# Patient Record
Sex: Female | Born: 1940 | Race: Black or African American | Hispanic: No | Smoking: Never smoker
Health system: Southern US, Community
[De-identification: ages and names within clinical notes are randomized; demographics above are authoritative.]

## PROBLEM LIST (undated history)

## (undated) DIAGNOSIS — I10 Essential (primary) hypertension: Secondary | ICD-10-CM

## (undated) DIAGNOSIS — M1990 Unspecified osteoarthritis, unspecified site: Secondary | ICD-10-CM

## (undated) HISTORY — PX: TONSILLECTOMY: SUR1361

## (undated) HISTORY — PX: KNEE SURGERY: SHX244

## (undated) HISTORY — PX: ABDOMINAL HYSTERECTOMY: SHX81

## (undated) HISTORY — PX: APPENDECTOMY: SHX54

---

## 2010-10-27 ENCOUNTER — Encounter: Payer: Self-pay | Admitting: Emergency Medicine

## 2010-10-27 ENCOUNTER — Emergency Department (HOSPITAL_BASED_OUTPATIENT_CLINIC_OR_DEPARTMENT_OTHER)
Admission: EM | Admit: 2010-10-27 | Discharge: 2010-10-27 | Disposition: A | Discharge status: Home / Self Care | Payer: Medicare Other | Attending: Emergency Medicine | Admitting: Emergency Medicine

## 2010-10-27 DIAGNOSIS — B029 Zoster without complications: Secondary | ICD-10-CM | POA: Insufficient documentation

## 2010-10-27 DIAGNOSIS — I10 Essential (primary) hypertension: Secondary | ICD-10-CM | POA: Insufficient documentation

## 2010-10-27 DIAGNOSIS — Z8739 Personal history of other diseases of the musculoskeletal system and connective tissue: Secondary | ICD-10-CM | POA: Insufficient documentation

## 2010-10-27 HISTORY — DX: Unspecified osteoarthritis, unspecified site: M19.90

## 2010-10-27 HISTORY — DX: Essential (primary) hypertension: I10

## 2010-10-27 MED ORDER — VALACYCLOVIR HCL 1 G PO TABS: 1000.0000 mg | ORAL_TABLET | Freq: Three times a day (TID) | ORAL | Status: AC

## 2010-10-27 MED ORDER — HYDROCODONE-ACETAMINOPHEN 5-325 MG PO TABS
1.0000 | ORAL_TABLET | Freq: Once | ORAL | Status: AC
  Administered 2010-10-27: 1 via ORAL
  Filled 2010-10-27: qty 1

## 2010-10-27 MED ORDER — PREDNISONE 50 MG PO TABS: 50.0000 mg | ORAL_TABLET | Freq: Every day | ORAL | Status: AC

## 2010-10-27 MED ORDER — PREDNISONE 20 MG PO TABS
60.0000 mg | ORAL_TABLET | Freq: Once | ORAL | Status: AC
  Administered 2010-10-27: 60 mg via ORAL
  Filled 2010-10-27: qty 3

## 2010-10-27 MED ORDER — HYDROCODONE-ACETAMINOPHEN 5-325 MG PO TABS: 1.0000 | ORAL_TABLET | ORAL | Status: AC | PRN

## 2010-10-27 NOTE — ED Provider Notes (Addendum)
History    Scribed for Mary Booze, MD, the patient was seen in room MH11/MH11 . This chart was scribed by Desma Paganini. This patient's care was started at 6:22 PM .   CSN: [id number] Arrival date & time: 10/27/2010  6:12 PM  Chief Complaint  Patient presents with  . Headache  . Facial Pain   HPI Mary Galloway is a 70 y.o. female who presents to the Emergency Department complaining of left sided facial and ear pain and a left sided HA that has been progressing for 3 days. Pain is described as throbbing and soreness and was rated as a 8/10. Pt states that the top of her head is sensitive to touch. The pt. has not taken any ibuprofen or tylenol for the pain. Pt also has a recent h/o swollen gland behind her left ear, seen by PCP at onset 4 days ago, has been taking Clarithromycin since for a possible "gland infection". Pt is normally in good health. No fever, chills.  PCP Livia Snellen   PAST MEDICAL HISTORY:  Past Medical History  Diagnosis Date  . Hypertension   . Arthritis      PAST SURGICAL HISTORY:  Past Surgical History  Procedure Date  . Abdominal hysterectomy      MEDICATIONS:  Previous Medications   CLARITHROMYCIN (BIAXIN) 500 MG TABLET    Take 500 mg by mouth 2 (two) times daily after a meal.     MELOXICAM (MOBIC) 7.5 MG TABLET    Take 7.5 mg by mouth daily.     QUINAPRIL-HYDROCHLOROTHIAZIDE (ACCURETIC) 20-12.5 MG PER TABLET    Take 1 tablet by mouth daily.       ALLERGIES:  Allergies as of 10/27/2010 - Review Complete 10/27/2010  Allergen Reaction Noted  . Penicillins Rash 10/27/2010     FAMILY HISTORY:   No family history on file.   SOCIAL HISTORY: No smoking No Driking     Review of Systems 10 Systems reviewed and are negative for acute change except as noted in the HPI.  Physical Exam  BP 150/72  Pulse 80  Temp(Src) 97.7 F (36.5 C) (Oral)  Resp 16  SpO2 99%  Physical Exam  Nursing note and vitals reviewed. Constitutional: She is oriented  to person, place, and time. She appears well-developed and well-nourished.  HENT:  Head: Normocephalic and atraumatic.       Mildly tender left post auricular lymph node    Neck: Normal range of motion. Neck supple.  Cardiovascular: Normal rate, normal heart sounds and intact distal pulses.   Pulmonary/Chest: Effort normal and breath sounds normal.  Abdominal: Bowel sounds are normal. She exhibits no distension. There is no tenderness.  Musculoskeletal: Normal range of motion. She exhibits no edema and no tenderness.  Neurological: She is alert and oriented to person, place, and time.  Skin: Skin is warm and dry. No rash noted.  Psychiatric: She has a normal mood and affect.  Careful exam of the scalp showed no rash, but there was tenderness in moving her hair.  ED Course  Procedures  OTHER DATA REVIEWED: Nursing notes and vital signs reviewed.     MDM: Clinically she has early Herpes Zoster. Because her hair would obscure development of a rash, will institute treatment empirically.   IMPRESSION: Herpes zoster    PLAN:  Home  The patient is to return the emergency department if there is any worsening of symptoms. I have reviewed the discharge instructions with the patient.   CONDITION ON DISCHARGE:  Stable    DISCHARGE MEDICATIONS: New Prescriptions   HYDROCODONE-ACETAMINOPHEN (NORCO) 5-325 MG PER TABLET    Take 1 tablet by mouth every 4 (four) hours as needed for pain.   PREDNISONE (DELTASONE) 50 MG TABLET    Take 1 tablet (50 mg total) by mouth daily.   VALACYCLOVIR (VALTREX) 1000 MG TABLET    Take 1 tablet (1,000 mg total) by mouth 3 (three) times daily.    I personally performed the services described in this documentation, which was scribed in my presence. The recorded information has been reviewed and considered.        Mary Booze, MD 10/27/10 1844  Mary Booze, MD 11/21/10 1537

## 2010-10-27 NOTE — ED Notes (Signed)
Pt c/o LT side facial & ear pain, as well as LT side HA x 3 days- is currently taking Clarithromycin for a "gland infection" behind LT ear

## 2011-05-10 ENCOUNTER — Emergency Department (HOSPITAL_BASED_OUTPATIENT_CLINIC_OR_DEPARTMENT_OTHER)
Admission: EM | Admit: 2011-05-10 | Discharge: 2011-05-10 | Disposition: A | Discharge status: Home Health | Payer: Medicare Other | Attending: Emergency Medicine | Admitting: Emergency Medicine

## 2011-05-10 ENCOUNTER — Encounter (HOSPITAL_BASED_OUTPATIENT_CLINIC_OR_DEPARTMENT_OTHER): Payer: Self-pay | Admitting: Emergency Medicine

## 2011-05-10 DIAGNOSIS — M79609 Pain in unspecified limb: Secondary | ICD-10-CM | POA: Insufficient documentation

## 2011-05-10 DIAGNOSIS — R109 Unspecified abdominal pain: Secondary | ICD-10-CM | POA: Insufficient documentation

## 2011-05-10 DIAGNOSIS — M545 Low back pain, unspecified: Secondary | ICD-10-CM | POA: Insufficient documentation

## 2011-05-10 DIAGNOSIS — Z79899 Other long term (current) drug therapy: Secondary | ICD-10-CM | POA: Insufficient documentation

## 2011-05-10 DIAGNOSIS — I10 Essential (primary) hypertension: Secondary | ICD-10-CM | POA: Insufficient documentation

## 2011-05-10 DIAGNOSIS — Z8739 Personal history of other diseases of the musculoskeletal system and connective tissue: Secondary | ICD-10-CM | POA: Insufficient documentation

## 2011-05-10 LAB — URINALYSIS, ROUTINE W REFLEX MICROSCOPIC
Bilirubin Urine: NEGATIVE
Hgb urine dipstick: NEGATIVE
Ketones, ur: NEGATIVE mg/dL
Protein, ur: NEGATIVE mg/dL
Urobilinogen, UA: 1 mg/dL (ref 0.0–1.0)

## 2011-05-10 MED ORDER — DIAZEPAM 5 MG PO TABS: 5.0000 mg | ORAL_TABLET | Freq: Two times a day (BID) | ORAL | Status: AC

## 2011-05-10 MED ORDER — DIAZEPAM 5 MG PO TABS
5.0000 mg | ORAL_TABLET | Freq: Once | ORAL | Status: AC
  Administered 2011-05-10: 5 mg via ORAL
  Filled 2011-05-10: qty 1

## 2011-05-10 MED ORDER — ACETAMINOPHEN 325 MG PO TABS
975.0000 mg | ORAL_TABLET | Freq: Once | ORAL | Status: AC
  Administered 2011-05-10: 975 mg via ORAL
  Filled 2011-05-10: qty 3

## 2011-05-10 NOTE — ED Notes (Signed)
Left flank pain radiating down right leg x 2 weeks.  No numbness or tingling in leg, no dysuria, no problems with BM.  Denies injury.  Lifting worsens pain.  Pain worse last night.

## 2011-05-10 NOTE — ED Provider Notes (Addendum)
History     CSN: [id number]  Arrival date & time 05/10/11  4098   First MD Initiated Contact with Patient 05/10/11 [phone]      Chief Complaint  Patient presents with  . Flank Pain  . Leg Pain    (Consider location/radiation/quality/duration/timing/severity/associated sxs/prior treatment) HPI Comments: Patient complains of right side pain that radiates down to her right hip.  She denies any weakness or numbness in her legs.  She has no bladder or bowel incontinence.  She denies any abdominal pain, nausea, vomiting, changes in bowel habits.  No fevers.  No specific inciting injury.  She does note that this feels similar to when she's had muscle spasms in the past.  She has been trying over-the-counter pain medicine without relief.  She has not seen her primary care physician for this in the last 2 weeks since this started.  The pain has been gradually worsening it is worse last night so she could not sleep which is why she presents this morning.  Patient is a 71 y.o. female presenting with flank pain and leg pain. The history is provided by the patient. No language interpreter was used.  Flank Pain This is a recurrent problem. The current episode started more than 1 week ago. The problem occurs constantly. The problem has been gradually worsening. Pertinent negatives include no chest pain, no abdominal pain, no headaches and no shortness of breath. The symptoms are aggravated by bending (Lifting HER-89-year-old grandson). The symptoms are relieved by nothing. She has tried acetaminophen for the symptoms. The treatment provided mild relief.  Leg Pain     Past Medical History  Diagnosis Date  . Hypertension   . Arthritis     Past Surgical History  Procedure Date  . Abdominal hysterectomy   . Tonsillectomy   . Appendectomy     History reviewed. No pertinent family history.  History  Substance Use Topics  . Smoking status: Never Smoker   . Smokeless tobacco: Not on file  . Alcohol  Use: No    OB History    Grav Para Term Preterm Abortions TAB SAB Ect Mult Living                  Review of Systems  Constitutional: Negative.  Negative for fever and chills.  HENT: Negative.   Eyes: Negative.  Negative for discharge and redness.  Respiratory: Negative.  Negative for cough and shortness of breath.   Cardiovascular: Negative.  Negative for chest pain.  Gastrointestinal: Negative.  Negative for nausea, vomiting, abdominal pain and diarrhea.  Genitourinary: Positive for flank pain. Negative for dysuria and vaginal discharge.  Musculoskeletal: Positive for back pain.  Skin: Negative.  Negative for color change and rash.  Neurological: Negative.  Negative for syncope and headaches.  Hematological: Negative.  Negative for adenopathy.  Psychiatric/Behavioral: Negative.  Negative for confusion.  All other systems reviewed and are negative.    Allergies  Penicillins  Home Medications   Current Outpatient Rx  Name Route Sig Dispense Refill  . CALCIUM CARBONATE 600 MG PO TABS Oral Take 600 mg by mouth 2 (two) times daily.      Marland Kitchen CLARITHROMYCIN 500 MG PO TABS Oral Take 500 mg by mouth 2 (two) times daily after a meal.      . OMEGA-3 FATTY ACIDS 1000 MG PO CAPS Oral Take 1 g by mouth daily.      . IBUPROFEN-DIPHENHYDRAMINE CIT 200-38 MG PO TABS Oral Take 1-2 tablets by mouth at  bedtime as needed. For sleep     . POLYSACCHARIDE IRON COMPLEX 150 MG PO CAPS Oral Take 150 mg by mouth daily.      . MELOXICAM 7.5 MG PO TABS Oral Take 7.5 mg by mouth daily.      Marland Kitchen OMEPRAZOLE MAGNESIUM 20 MG PO TBEC Oral Take 20 mg by mouth daily.      . QUINAPRIL-HYDROCHLOROTHIAZIDE 20-12.5 MG PO TABS Oral Take 1 tablet by mouth daily.        BP 161/79  Pulse 92  Temp(Src) 97.6 F (36.4 C) (Oral)  Resp 22  SpO2 100%  Physical Exam  Nursing note and vitals reviewed. Constitutional: She is oriented to person, place, and time. She appears well-developed and well-nourished.  Non-toxic  appearance. She does not have a sickly appearance.  HENT:  Head: Normocephalic and atraumatic.  Eyes: Conjunctivae, EOM and lids are normal. Pupils are equal, round, and reactive to light. No scleral icterus.  Neck: Trachea normal and normal range of motion. Neck supple.  Cardiovascular: Regular rhythm and normal heart sounds.   Pulmonary/Chest: Effort normal and breath sounds normal.  Abdominal: Soft. Normal appearance. There is no tenderness. There is no rebound, no guarding and no CVA tenderness.  Musculoskeletal: Normal range of motion.       No focal T-spine or L-spine tenderness or step-offs on examination.  Negative straight right leg test bilaterally.  Patient has a palpable DP and PT pulse in that right leg.  She has full range of motion at her right hip and is able to bear weight with ease  Neurological: She is alert and oriented to person, place, and time. She has normal strength.  Skin: Skin is warm, dry and intact. No rash noted.       No rash is noted on her flank  Psychiatric: She has a normal mood and affect. Her behavior is normal. Judgment and thought content normal.    ED Course  Procedures (including critical care time)  Results for orders placed during the hospital encounter of 05/10/11  URINALYSIS, ROUTINE W REFLEX MICROSCOPIC      Component Value Range   Color, Urine YELLOW  YELLOW    APPearance CLEAR  CLEAR    Specific Gravity, Urine 1.012  1.005 - 1.030    pH 7.0  5.0 - 8.0    Glucose, UA NEGATIVE  NEGATIVE (mg/dL)   Hgb urine dipstick NEGATIVE  NEGATIVE    Bilirubin Urine NEGATIVE  NEGATIVE    Ketones, ur NEGATIVE  NEGATIVE (mg/dL)   Protein, ur NEGATIVE  NEGATIVE (mg/dL)   Urobilinogen, UA 1.0  0.0 - 1.0 (mg/dL)   Nitrite NEGATIVE  NEGATIVE    Leukocytes, UA NEGATIVE  NEGATIVE         MDM  Patient appears to have a likely muscle spasm as she has no symptoms that appear to be consistent with sciatica at this time.  She has no neurologic deficits to  indicate spinal cord pathology at this time.  Patient has no abdominal pain or other GI symptoms to suggest that this is an intra-abdominal problem at this point in time.  The pain is not a tearing sensation to suggest aortic dissection.  I will check the patient's urinalysis to rule out urinary tract infection or signs of blood which might indicate kidney stone but at this point Tylenol treatment the patient for muscle spasm and if her urine is negative will have her followup with her primary care physician later this week  for reevaluation.        Nat Christen, MD 05/10/11 [phone]  Patient feels better after the Valium and I will discharge her home.  She's been instructed to followup with her primary care physician if this is not improving and specifically she begins having other new symptoms such as weakness or numbness in her legs.  Nat Christen, MD 05/10/11 1032

## 2011-05-10 NOTE — Discharge Instructions (Signed)
Back Pain, Adult Low back pain is very common. About 1 in 5 people have back pain.The cause of low back pain is rarely dangerous. The pain often gets better over time.About half of people with a sudden onset of back pain feel better in just 2 weeks. About 8 in 10 people feel better by 6 weeks.  CAUSES Some common causes of back pain include:  Strain of the muscles or ligaments supporting the spine.   Wear and tear (degeneration) of the spinal discs.   Arthritis.   Direct injury to the back.  DIAGNOSIS Most of the time, the direct cause of low back pain is not known.However, back pain can be treated effectively even when the exact cause of the pain is unknown.Answering your caregiver's questions about your overall health and symptoms is one of the most accurate ways to make sure the cause of your pain is not dangerous. If your caregiver needs more information, he or she may order lab work or imaging tests (X-rays or MRIs).However, even if imaging tests show changes in your back, this usually does not require surgery. HOME CARE INSTRUCTIONS For many people, back pain returns.Since low back pain is rarely dangerous, it is often a condition that people can learn to manageon their own.   Remain active. It is stressful on the back to sit or stand in one place. Do not sit, drive, or stand in one place for more than 30 minutes at a time. Take short walks on level surfaces as soon as pain allows.Try to increase the length of time you walk each day.   Do not stay in bed.Resting more than 1 or 2 days can delay your recovery.   Do not avoid exercise or work.Your body is made to move.It is not dangerous to be active, even though your back may hurt.Your back will likely heal faster if you return to being active before your pain is gone.   Pay attention to your body when you bend and lift. Many people have less discomfortwhen lifting if they bend their knees, keep the load close to their  bodies,and avoid twisting. Often, the most comfortable positions are those that put less stress on your recovering back.   Find a comfortable position to sleep. Use a firm mattress and lie on your side with your knees slightly bent. If you lie on your back, put a pillow under your knees.   Only take over-the-counter or prescription medicines as directed by your caregiver. Over-the-counter medicines to reduce pain and inflammation are often the most helpful.Your caregiver may prescribe muscle relaxant drugs.These medicines help dull your pain so you can more quickly return to your normal activities and healthy exercise.   Put ice on the injured area.   Put ice in a plastic bag.   Place a towel between your skin and the bag.   Leave the ice on for 15 to 20 minutes, 3 to 4 times a day for the first 2 to 3 days. After that, ice and heat may be alternated to reduce pain and spasms.   Ask your caregiver about trying back exercises and gentle massage. This may be of some benefit.   Avoid feeling anxious or stressed.Stress increases muscle tension and can worsen back pain.It is important to recognize when you are anxious or stressed and learn ways to manage it.Exercise is a great option.  SEEK MEDICAL CARE IF:  You have pain that is not relieved with rest or medicine.   You have   pain that does not improve in 1 week.   You have new symptoms.   You are generally not feeling well.  SEEK IMMEDIATE MEDICAL CARE IF:   You have pain that radiates from your back into your legs.   You develop new bowel or bladder control problems.   You have unusual weakness or numbness in your arms or legs.   You develop nausea or vomiting.   You develop abdominal pain.   You feel faint.  Document Released: 03/03/2005 Document Revised: 11/13/2010 Document Reviewed: 07/22/2010 [employer] Patient Information 2012 [employer]. 

## 2012-08-21 DIAGNOSIS — I10 Essential (primary) hypertension: Secondary | ICD-10-CM | POA: Insufficient documentation

## 2014-04-22 ENCOUNTER — Encounter (HOSPITAL_BASED_OUTPATIENT_CLINIC_OR_DEPARTMENT_OTHER): Payer: Self-pay | Admitting: *Deleted

## 2014-04-22 ENCOUNTER — Emergency Department (HOSPITAL_BASED_OUTPATIENT_CLINIC_OR_DEPARTMENT_OTHER)
Admission: EM | Admit: 2014-04-22 | Discharge: 2014-04-22 | Disposition: A | Discharge status: Home / Self Care | Payer: Medicare HMO | Attending: Emergency Medicine | Admitting: Emergency Medicine

## 2014-04-22 ENCOUNTER — Emergency Department (HOSPITAL_BASED_OUTPATIENT_CLINIC_OR_DEPARTMENT_OTHER): Payer: Medicare HMO

## 2014-04-22 DIAGNOSIS — M1990 Unspecified osteoarthritis, unspecified site: Secondary | ICD-10-CM | POA: Diagnosis not present

## 2014-04-22 DIAGNOSIS — M79605 Pain in left leg: Secondary | ICD-10-CM | POA: Insufficient documentation

## 2014-04-22 DIAGNOSIS — Z79899 Other long term (current) drug therapy: Secondary | ICD-10-CM | POA: Insufficient documentation

## 2014-04-22 DIAGNOSIS — I10 Essential (primary) hypertension: Secondary | ICD-10-CM | POA: Insufficient documentation

## 2014-04-22 MED ORDER — HYDROCODONE-ACETAMINOPHEN 5-325 MG PO TABS
2.0000 | ORAL_TABLET | Freq: Once | ORAL | Status: AC
  Administered 2014-04-22: 2 via ORAL
  Filled 2014-04-22: qty 2

## 2014-04-22 MED ORDER — HYDROCODONE-ACETAMINOPHEN 5-325 MG PO TABS: 1.0000 | ORAL_TABLET | Freq: Four times a day (QID) | ORAL | Status: DC | PRN

## 2014-04-22 NOTE — ED Provider Notes (Signed)
CSN: [id number]     Arrival date & time 04/22/14  1720 History  This chart was scribed for Mary Skeens, MD by [place] Peace, ED Scribe. The patient was seen in MH08/MH08. The patient's care was started at 7:49 PM.    Chief Complaint  Patient presents with  . Leg Pain      Patient is a 74 y.o. female presenting with leg pain. The history is provided by the patient. No language interpreter was used.  Leg Pain Associated symptoms: no fever     HPI Comments: Mary Galloway is a 74 y.o. female who presents to the Emergency Department complaining of radiating left leg pain onset 1 week ago. Pt reports pain extends from foot and up into her hip. Pain is mostly to lateral aspect of her leg. She also complains of chills and weakness in affected leg. Pain exacerbated with ambulation and twisting of her left hip.  She denies any mechanisms of injury or changes in the way she has been walking. No complaints of fever, numbness, or chest pain. History of hypertension and arthritis. Allergies to Penicillin.    Past Medical History  Diagnosis Date  . Hypertension   . Arthritis    Past Surgical History  Procedure Laterality Date  . Abdominal hysterectomy    . Tonsillectomy    . Appendectomy    . Knee surgery     No family history on file. History  Substance Use Topics  . Smoking status: Never Smoker   . Smokeless tobacco: Not on file  . Alcohol Use: No   OB History    No data available     Review of Systems  Constitutional: Positive for chills. Negative for fever.  Cardiovascular: Negative for chest pain.  Musculoskeletal:       Lower left leg pain.   Neurological: Positive for weakness. Negative for numbness.      Allergies  Penicillins  Home Medications   Prior to Admission medications   Medication Sig Start Date End Date Taking? Authorizing Provider  potassium chloride SA (K-DUR,KLOR-CON) 20 MEQ tablet Take 20 mEq by mouth 2 (two) times daily.   Yes Historical Provider,  MD  calcium carbonate (OS-CAL) 600 MG TABS Take 600 mg by mouth 2 (two) times daily.      Historical Provider, MD  clarithromycin (BIAXIN) 500 MG tablet Take 500 mg by mouth 2 (two) times daily after a meal.      Historical Provider, MD  fish oil-omega-3 fatty acids 1000 MG capsule Take 1 g by mouth daily.      Historical Provider, MD  HYDROcodone-acetaminophen (NORCO) 5-325 MG per tablet Take 1 tablet by mouth every 6 (six) hours as needed for severe pain. 04/22/14   Mary Skeens, MD  Ibuprofen-Diphenhydramine Cit 200-38 MG TABS Take 1-2 tablets by mouth at bedtime as needed. For sleep     Historical Provider, MD  iron polysaccharides (NIFEREX) 150 MG capsule Take 150 mg by mouth daily.      Historical Provider, MD  meloxicam (MOBIC) 7.5 MG tablet Take 7.5 mg by mouth daily.      Historical Provider, MD  omeprazole (PRILOSEC OTC) 20 MG tablet Take 20 mg by mouth daily.      Historical Provider, MD  quinapril-hydrochlorothiazide (ACCURETIC) 20-12.5 MG per tablet Take 1 tablet by mouth daily.      Historical Provider, MD   BP 108/69 mmHg  Pulse 89  Temp(Src) 97.7 F (36.5 C) (Oral)  Resp 18  SpO2 100% Physical Exam  Constitutional: She is oriented to person, place, and time. She appears well-developed and well-nourished. No distress.  HENT:  Head: Normocephalic and atraumatic.  Eyes: Conjunctivae and EOM are normal.  Neck: Neck supple. No tracheal deviation present.  Cardiovascular: Normal rate.   Pulses:      Dorsalis pedis pulses are 2+ on the right side, and 2+ on the left side.       Posterior tibial pulses are 2+ on the right side, and 2+ on the left side.  Pulmonary/Chest: Effort normal. No respiratory distress.  Musculoskeletal: Normal range of motion. She exhibits tenderness.  Tenderness to lateral medial lower leg. No warmth, no induration.  No effusion in knee. Good ROM.  Tenderness to lateral thigh.  No tenderness or effusion to ankle.   Neurological: She is alert and  oriented to person, place, and time.  Skin: Skin is warm and dry.  Psychiatric: She has a normal mood and affect. Her behavior is normal.  Nursing note and vitals reviewed.   ED Course  Procedures (including critical care time) Labs Review Labs Reviewed - No data to display  Imaging Review Dg Tibia/fibula Left  04/22/2014   CLINICAL DATA:  One week history of pain involving the lateral aspect of the lower leg. Prior left total knee arthroplasty.  EXAM: LEFT TIBIA AND FIBULA - 2 VIEW  COMPARISON:  None.  FINDINGS: Left total knee arthroplasty with anatomic alignment and no complicating features. No acute fracture involving the tibial or fibula. Well preserved bone mineral density. No intrinsic osseous abnormality. Visualized ankle joint intact.  IMPRESSION: No acute osseous abnormality. 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   Electronically Signed   By: Mary Galloway  Lawrence M.D.   On: 04/22/2014 20:37     EKG Interpretation None      Medications  HYDROcodone-acetaminophen (NORCO/VICODIN) 5-325 MG per tablet 2 tablet (2 tablets Oral Given 04/22/14 1959)    7:54 PM- Treatment plan was discussed with patient who verbalizes understanding and agrees.   MDM   Final diagnoses:  Left leg pain   Patient with arthritis history presents with reproducible pain lateral left leg. No posterior leg or medial leg tenderness, no classic blood clot risk factors. No swelling to the leg. X-ray reviewed no acute fracture. Discussed outpatient follow-up with her orthopedic surgeon and primary doctor.  Results and differential diagnosis were discussed with the patient/parent/guardian. Close follow up outpatient was discussed, comfortable with the plan.   Medications  HYDROcodone-acetaminophen (NORCO/VICODIN) 5-325 MG per tablet 2 tablet (2 tablets Oral Given 04/22/14 1959)    Filed Vitals:   04/22/14 1728  BP: 108/69  Pulse: 89  Temp: 97.7 F (36.5 C)   TempSrc: Oral  Resp: 18  SpO2: 100%    Final diagnoses:  Left leg pain       Mary SkeensJoshua M Letina Luckett, MD 04/22/14 2123

## 2014-04-22 NOTE — ED Notes (Signed)
Patient states that her left leg has been hurting for a few weeks. Denies injury, states that the pain is her entire leg

## 2014-04-22 NOTE — Discharge Instructions (Signed)
If you were given medicines take as directed.  If you are on coumadin or contraceptives realize their levels and effectiveness is altered by many different medicines.  If you have any reaction (rash, tongues swelling, other) to the medicines stop taking and see a physician.  For severe pain take norco or vicodin however realize they have the potential for addiction and it can make you sleepy and has tylenol in it.  No operating machinery while taking.  Please follow up as directed and return to the ER or see a physician for new or worsening symptoms.  Thank you. Filed Vitals:   04/22/14 1728  BP: 108/69  Pulse: 89  Temp: 97.7 F (36.5 C)  TempSrc: Oral  Resp: 18  SpO2: 100%

## 2014-04-28 DIAGNOSIS — M4316 Spondylolisthesis, lumbar region: Secondary | ICD-10-CM | POA: Insufficient documentation

## 2014-04-28 DIAGNOSIS — M5136 Other intervertebral disc degeneration, lumbar region: Secondary | ICD-10-CM | POA: Insufficient documentation

## 2015-11-20 DIAGNOSIS — H40243 Residual stage of angle-closure glaucoma, bilateral: Secondary | ICD-10-CM | POA: Insufficient documentation

## 2016-08-07 DIAGNOSIS — H2510 Age-related nuclear cataract, unspecified eye: Secondary | ICD-10-CM | POA: Insufficient documentation

## 2017-06-30 ENCOUNTER — Emergency Department (HOSPITAL_BASED_OUTPATIENT_CLINIC_OR_DEPARTMENT_OTHER)
Admission: EM | Admit: 2017-06-30 | Discharge: 2017-06-30 | Disposition: A | Discharge status: Home / Self Care | Payer: Medicare HMO | Attending: Emergency Medicine | Admitting: Emergency Medicine

## 2017-06-30 ENCOUNTER — Other Ambulatory Visit: Payer: Self-pay

## 2017-06-30 ENCOUNTER — Emergency Department (HOSPITAL_BASED_OUTPATIENT_CLINIC_OR_DEPARTMENT_OTHER): Payer: Medicare HMO

## 2017-06-30 ENCOUNTER — Encounter (HOSPITAL_BASED_OUTPATIENT_CLINIC_OR_DEPARTMENT_OTHER): Payer: Self-pay | Admitting: *Deleted

## 2017-06-30 DIAGNOSIS — M79604 Pain in right leg: Secondary | ICD-10-CM | POA: Diagnosis present

## 2017-06-30 DIAGNOSIS — I10 Essential (primary) hypertension: Secondary | ICD-10-CM | POA: Diagnosis not present

## 2017-06-30 DIAGNOSIS — Z79899 Other long term (current) drug therapy: Secondary | ICD-10-CM | POA: Diagnosis not present

## 2017-06-30 DIAGNOSIS — M5431 Sciatica, right side: Secondary | ICD-10-CM | POA: Diagnosis not present

## 2017-06-30 MED ORDER — HYDROCODONE-ACETAMINOPHEN 5-325 MG PO TABS
1.0000 | ORAL_TABLET | Freq: Once | ORAL | Status: AC
  Administered 2017-06-30: 1 via ORAL
  Filled 2017-06-30: qty 1

## 2017-06-30 MED ORDER — HYDROCODONE-ACETAMINOPHEN 5-325 MG PO TABS: ORAL_TABLET | ORAL | 0 refills | Status: DC

## 2017-06-30 MED ORDER — PREDNISONE 20 MG PO TABS: ORAL_TABLET | ORAL | 0 refills | Status: DC

## 2017-06-30 NOTE — ED Provider Notes (Signed)
[facility] EMERGENCY DEPARTMENT Provider Note   CSN: [id number] Arrival date & time: 06/30/17  1840     History   Chief Complaint Chief Complaint  Patient presents with  . Leg Pain    HPI Mary Galloway is a 77 y.o. female.  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  Pain is shooting and burning in nature.  She has not had any associated swelling.  It is worse in certain positions and with certain movements.  She has had pain from her back go down into her legs before but cannot remember which leg.  No associated fevers or redness of the leg.  No history of blood clots.  Patient is not on anticoagulation. Patient denies warning symptoms of back pain including: fecal incontinence, urinary retention or overflow incontinence, night sweats, waking from sleep with back pain, unexplained fevers or weight loss, h/o cancer, IVDU, recent trauma.        Past Medical History:  Diagnosis Date  . Arthritis   . Hypertension     There are no active problems to display for this patient.   Past Surgical History:  Procedure Laterality Date  . ABDOMINAL HYSTERECTOMY    . APPENDECTOMY    . KNEE SURGERY    . TONSILLECTOMY       OB History   None      Home Medications    Prior to Admission medications   Medication Sig Start Date End Date Taking? Authorizing Provider  calcium carbonate (OS-CAL) 600 MG TABS Take 600 mg by mouth 2 (two) times daily.      [provider]  clarithromycin (BIAXIN) 500 MG tablet Take 500 mg by mouth 2 (two) times daily after a meal.      [provider]  fish oil-omega-3 fatty acids 1000 MG capsule Take 1 g by mouth daily.      [provider]  HYDROcodone-acetaminophen (NORCO/VICODIN) 5-325 MG tablet Take 0.5-1 tablets every 6 hours as needed for severe pain 06/30/17   Renne Crigler, PA-C  Ibuprofen-Diphenhydramine Cit 200-38 MG TABS Take 1-2 tablets by mouth at bedtime as needed. For sleep     [provider]  iron polysaccharides (NIFEREX) 150 MG capsule Take 150 mg by mouth daily.      [provider]  meloxicam (MOBIC) 7.5 MG tablet Take 7.5 mg by mouth daily.      [provider]  omeprazole (PRILOSEC OTC) 20 MG tablet Take 20 mg by mouth daily.      [provider]  potassium chloride SA (K-DUR,KLOR-CON) 20 MEQ tablet Take 20 mEq by mouth 2 (two) times daily.    [provider]  predniSONE (DELTASONE) 20 MG tablet 3 Tabs PO Days 1-3, then 2 tabs PO Days 4-6, then 1 tab PO Day 7-9, then Half Tab PO Day 10-12 06/30/17   Renne Crigler, PA-C  quinapril-hydrochlorothiazide (ACCURETIC) 20-12.5 MG per tablet Take 1 tablet by mouth daily.      [provider]    Family History History reviewed. No pertinent family history.  Social History Social History   Tobacco Use  . Smoking status: Never Smoker  . Smokeless tobacco: Never Used  Substance Use Topics  . Alcohol use: No  . Drug use: No     Allergies   Penicillins  Review of Systems Review of Systems  Constitutional: Negative for fever and unexpected weight change.  HENT: Negative for rhinorrhea and sore throat.   Eyes: Negative for redness.  Respiratory: Negative for cough.   Cardiovascular: Negative for chest pain and leg swelling.  Gastrointestinal: Negative for abdominal pain, constipation, diarrhea, nausea and vomiting.       Negative for fecal incontinence.   Genitourinary: Negative for dysuria, flank pain, hematuria, pelvic pain, vaginal bleeding and vaginal discharge.       Negative for urinary incontinence or retention.  Musculoskeletal: Positive for myalgias. Negative for back pain.  Skin: Negative for rash.  Neurological: Negative for weakness, numbness and headaches.       Denies saddle paresthesias.     Physical Exam Updated  Vital Signs BP (!) 167/84 (BP Location: Right Arm)   Pulse 94   Temp 98.5 F (36.9 C)   Resp 18   Ht 5\' 1"  (1.549 m)   Wt 96.2 kg (212 lb)   SpO2 98%   BMI 40.06 kg/m   Physical Exam  Constitutional: She appears well-developed and well-nourished.  HENT:  Head: Normocephalic and atraumatic.  Eyes: Conjunctivae are normal.  Neck: Normal range of motion. Neck supple.  Cardiovascular:  Pulses:      Dorsalis pedis pulses are 2+ on the right side, and 2+ on the left side.  Pulmonary/Chest: Effort normal.  Abdominal: Soft. There is no tenderness. There is no CVA tenderness.  Musculoskeletal:       Right hip: Normal.       Left hip: Normal.       Right knee: Normal.       Left knee: Normal.       Cervical back: She exhibits normal range of motion, no tenderness and no bony tenderness.       Thoracic back: She exhibits normal range of motion, no tenderness and no bony tenderness.       Lumbar back: She exhibits normal range of motion, no tenderness and no bony tenderness.  No step-off noted with palpation of spine.   Neurological: She is alert. She has normal strength and normal reflexes. No sensory deficit.  5/5 strength in entire lower extremities bilaterally. No sensation deficit distally.  Patient is ambulatory with a walker without any evident foot drop.  Skin: Skin is warm and dry. No rash noted.  Psychiatric: She has a normal mood and affect.  Nursing note and vitals reviewed.    ED Treatments / Results  Labs (all labs ordered are listed, but only abnormal results are displayed) Labs Reviewed - No data to display  EKG None  Radiology US Venous Img Lower Unilateral Right  Result Date: 06/30/2017 CLINICAL DATA:  Acute onset of right thigh and calf pain. EXAM: RIGHT LOWER EXTREMITY VENOUS DOPPLER ULTRASOUND TECHNIQUE: Gray-scale sonography with graded compression, as well as color Doppler and duplex ultrasound were performed to evaluate the lower extremity deep venous  systems from the level of the common femoral vein and including the common femoral, femoral, profunda femoral, popliteal and calf veins including the posterior tibial, peroneal and gastrocnemius veins when visible. The superficial great saphenous vein was also interrogated. Spectral Doppler was utilized to evaluate flow at rest and with distal augmentation maneuvers in the common femoral, femoral and popliteal veins. COMPARISON:  None. FINDINGS: Contralateral Common Femoral Vein: Respiratory phasicity is normal and symmetric with the symptomatic side. No evidence of thrombus. Normal compressibility. Common Femoral Vein: No evidence of thrombus. Normal compressibility, respiratory  phasicity and response to augmentation. Saphenofemoral Junction: No evidence of thrombus. Normal compressibility and flow on color Doppler imaging. Profunda Femoral Vein: No evidence of thrombus. Normal compressibility and flow on color Doppler imaging. Femoral Vein: No evidence of thrombus. Normal compressibility, respiratory phasicity and response to augmentation. Popliteal Vein: No evidence of thrombus. Normal compressibility, respiratory phasicity and response to augmentation. Calf Veins: No evidence of thrombus. Normal compressibility and flow on color Doppler imaging. Superficial Great Saphenous Vein: No evidence of thrombus. Normal compressibility. Venous Reflux:  None. Other Findings:  None. IMPRESSION: No evidence of deep venous thrombosis. Electronically Signed   By: Roanna RaiderJeffery  Chang M.D.   On: 06/30/2017 22:00    Procedures Procedures (including critical care time)  Medications Ordered in ED Medications  HYDROcodone-acetaminophen (NORCO/VICODIN) 5-325 MG per tablet 1 tablet (1 tablet Oral Given 06/30/17 2059)     Initial Impression / Assessment and Plan / ED Course  I have reviewed the triage vital signs and the nursing notes.  Pertinent labs & imaging results that were available during my care of the patient were  reviewed by me and considered in my medical decision making (see chart for details).     Patient seen and examined.  Pain medication ordered.  Patient with leg pain, new onset.  No injury.  Sounds mostly radicular, however since patient does not have definite history of the same, will rule out blood clot with ultrasound.  2+ pedal pulses.  No signs of infection.  No red flags.  No focal back tenderness on exam.  Vital signs reviewed and are as follows: BP (!) 168/85 (BP Location: Left Arm)   Pulse 96   Temp 98.5 F (36.9 C)   Resp 18   Ht 5\' 1"  (1.549 m)   Wt 96.2 kg (212 lb)   SpO2 95%   BMI 40.06 kg/m   Patient discussed with and seen by Dr. Donnald GarrePfeiffer.  Ultrasound is negative.  11:19 PM patient noted improvement with medication given earlier.  She was updated on results.  We will discharged home with pain medication, continued home anti-inflammatories, and prednisone.  No history of diabetes.  Encouraged PCP follow-up.  Patient was counseled on back pain precautions and told to do activity as tolerated but do not lift, push, or pull heavy objects more than 10 pounds for the next week.  Patient counseled to use ice or heat on back for no longer than 15 minutes every hour.  Encouraged to use walker for stability.  Patient prescribed narcotic pain medicine and counseled on proper use of narcotic pain medications. Counseled not to combine this medication with others containing tylenol.  Use pain medication only under direct supervision at the lowest possible dose needed to control your pain.   Patient urged to follow-up with PCP if pain does not improve with treatment and rest or if pain becomes recurrent. Urged to return with worsening severe pain, loss of bowel or bladder control, trouble walking.   The patient verbalizes understanding and agrees with the plan.    Final Clinical Impressions(s) / ED Diagnoses   Final diagnoses:  Sciatica of right side   Back pain: Patient with  buttock and leg pain consistent with radiculopathy.  US neg for DVT.  No neurological deficits. Patient is ambulatory with a walker. No warning symptoms of back pain including: fecal incontinence, urinary retention or overflow incontinence, night sweats, waking from sleep with back pain, unexplained fevers or weight loss, h/o cancer, IVDU, recent trauma. No concern for  cauda equina, epidural abscess, or other serious cause of back pain. Conservative measures such as rest, ice/heat and pain medicine indicated with PCP follow-up if no improvement with conservative management.    ED Discharge Orders        Ordered    HYDROcodone-acetaminophen (NORCO/VICODIN) 5-325 MG tablet  Status:  Discontinued     06/30/17 2243    predniSONE (DELTASONE) 20 MG tablet  Status:  Discontinued     06/30/17 2243    HYDROcodone-acetaminophen (NORCO/VICODIN) 5-325 MG tablet     06/30/17 2253    predniSONE (DELTASONE) 20 MG tablet     06/30/17 2253       Renne Crigler, PA-C 06/30/17 2321    Arby Barrette, MD 07/01/17 [phone]

## 2017-06-30 NOTE — ED Notes (Signed)
Pt teaching provided on medications that may cause drowsiness. Pt instructed not to drive or operate heavy machinery while taking the prescribed medication. Pt verbalized understanding.   

## 2017-06-30 NOTE — Discharge Instructions (Signed)
Please read and follow all provided instructions.  Your diagnoses today include:  1. Sciatica of right side    Tests performed today include:  Vital signs - see below for your results today  Ultrasound of your leg - no signs of blood clot  Medications prescribed:   Vicodin (hydrocodone/acetaminophen) - narcotic pain medication  DO NOT drive or perform any activities that require you to be awake and alert because this medicine can make you drowsy. BE VERY CAREFUL not to take multiple medicines containing Tylenol (also called acetaminophen). Doing so can lead to an overdose which can damage your liver and cause liver failure and possibly death.  Use pain medication only under direct supervision at the lowest possible dose needed to control your pain.    Prednisone - steroid medicine   It is best to take this medication in the morning to prevent sleeping problems. If you are diabetic, monitor your blood sugar closely and stop taking Prednisone if blood sugar is over 300. Take with food to prevent stomach upset.   Take any prescribed medications only as directed.  Home care instructions:   Follow any educational materials contained in this packet  Please rest, use ice or heat on your back for the next several days  Do not lift, push, pull anything more than 10 pounds for the next week  Follow-up instructions: Please follow-up with your primary care provider in the next 3-5 week for further evaluation of your symptoms.   Return instructions:  SEEK IMMEDIATE MEDICAL ATTENTION IF YOU HAVE:  New numbness, tingling, weakness, or problem with the use of your arms or legs  Severe back pain not relieved with medications  Loss control of your bowels or bladder  Increasing pain in any areas of the body (such as chest or abdominal pain)  Shortness of breath, dizziness, or fainting.   Worsening nausea (feeling sick to your stomach), vomiting, fever, or sweats  Any other emergent  concerns regarding your health   Additional Information:  Your vital signs today were: BP (!) 167/84 (BP Location: Right Arm)    Pulse 94    Temp 98.5 F (36.9 C)    Resp 18    Ht 5\' 1"  (1.549 m)    Wt 96.2 kg (212 lb)    SpO2 98%    BMI 40.06 kg/m  If your blood pressure (BP) was elevated above 135/85 this visit, please have this repeated by your doctor within one month. --------------

## 2017-06-30 NOTE — ED Triage Notes (Signed)
Pt c/o right thigh pain w/o injury x 2 days ago

## 2017-06-30 NOTE — ED Provider Notes (Signed)
Medical screening examination/treatment/procedure(s) were conducted as a shared visit with non-physician practitioner(s) and myself.  I personally evaluated the patient during the encounter.  None Patient reports she has pain that started in her back in her right buttock.  Quality is burning in nature.  She reports it does get worse with certain movements.  She denies she has numbness or weakness of the leg.  She reports in the distant past she did have a bulging disc and think she had some neck similar.  Patient is alert and appropriate.  No acute respiratory distress.  Lungs are clear.  Heart is regular.  Abdomen soft nontender.  She does have pain reproducible to palpation in the right lower back and buttock.  Pain radiates down the leg.  Patient has symmetric 2+ distal pulses.  She is neurologically intact.  Findings are suggestive of sciatica.  Ultrasound negative.  I agree with plan of management.   Arby BarrettePfeiffer, Sydna Brodowski, MD 07/01/17 [phone]

## 2017-07-06 ENCOUNTER — Ambulatory Visit: Payer: Medicare HMO | Admitting: Family Medicine

## 2017-07-06 ENCOUNTER — Encounter: Payer: Self-pay | Admitting: Family Medicine

## 2017-07-06 DIAGNOSIS — M545 Low back pain, unspecified: Secondary | ICD-10-CM | POA: Insufficient documentation

## 2017-07-06 DIAGNOSIS — M79604 Pain in right leg: Secondary | ICD-10-CM | POA: Insufficient documentation

## 2017-07-06 MED ORDER — NAPROXEN 500 MG PO TABS: 500.0000 mg | ORAL_TABLET | Freq: Two times a day (BID) | ORAL | 1 refills | Status: DC | PRN

## 2017-07-06 MED ORDER — HYDROCODONE-ACETAMINOPHEN 7.5-325 MG PO TABS: 1.0000 | ORAL_TABLET | Freq: Four times a day (QID) | ORAL | 0 refills | Status: AC | PRN

## 2017-07-06 MED ORDER — TIZANIDINE HCL 4 MG PO TABS: 4.0000 mg | ORAL_TABLET | Freq: Four times a day (QID) | ORAL | 1 refills | Status: AC | PRN

## 2017-07-06 NOTE — Patient Instructions (Signed)
You have lumbar radiculopathy (a pinched nerve in your low back). Start naproxen 500mg  twice a day with food for pain and inflammation. Norco as needed for severe pain (no driving on this medicine). Tizanidine as needed for muscle spasms (no driving on this medicine if it makes you sleepy). Stay as active as possible. Physical therapy has been shown to be helpful as well. Strengthening of low back muscles, abdominal musculature are key for long term pain relief. We will go ahead with an MRI and I will contact you with the results.

## 2017-07-06 NOTE — Progress Notes (Signed)
PCP: Albertina Senegal, MD  Subjective:   HPI: Patient is a 77 y.o. female here for low back pain.  Patient reports she's had problems with low back in the past, known DDD and had injections. Current pain started about 2 weeks ago in right side of low back into right leg. Pain is sharp, severe at 7/10 level. Associated tingling down into foot and it will feel cold. She took prednisone without much benefit. Not taking any other medication for this. Difficulty getting comfortable. No bowel/bladder dysfunction.  Past Medical History:  Diagnosis Date  . Arthritis   . Hypertension     Current Outpatient Medications on File Prior to Visit  Medication Sig Dispense Refill  . hydrochlorothiazide (HYDRODIURIL) 25 MG tablet TAKE 1 TABLET EVERY DAY    . amLODipine (NORVASC) 2.5 MG tablet Take 2.5 mg by mouth daily.  2  . calcium carbonate (OS-CAL) 600 MG TABS Take 600 mg by mouth 2 (two) times daily.      . celecoxib (CELEBREX) 200 MG capsule TAKE 1 CAPSULE (200 MG TOTAL) BY MOUTH DAILY. FOLLOW UP APPT REQUIRED FOR ADDITIONAL REFILLS!  0  . Cholecalciferol (VITAMIN D3) 2000 units capsule Take by mouth.    . dorzolamide-timolol (COSOPT) 22.3-6.8 MG/ML ophthalmic solution INSTILL 1 DROP INTO BOTH EYES TWICE A DAY  11  . potassium chloride SA (K-DUR,KLOR-CON) 20 MEQ tablet Take 20 mEq by mouth 2 (two) times daily.    . quinapril (ACCUPRIL) 40 MG tablet TAKE 1 TABLET (40 MG TOTAL) BY MOUTH NIGHTLY.  3   No current facility-administered medications on file prior to visit.     Past Surgical History:  Procedure Laterality Date  . ABDOMINAL HYSTERECTOMY    . APPENDECTOMY    . KNEE SURGERY    . TONSILLECTOMY      Allergies  Allergen Reactions  . Brimonidine Other (See Comments)    Toxic conjunctivitis Toxic conjunctivitis   . Cephalexin Rash  . Penicillins Rash    Social History   Socioeconomic History  . Marital status: Widowed    Spouse name: Not on file  . Number of children:  Not on file  . Years of education: Not on file  . Highest education level: Not on file  Occupational History  . Not on file  Social Needs  . Financial resource strain: Not on file  . Food insecurity:    Worry: Not on file    Inability: Not on file  . Transportation needs:    Medical: Not on file    Non-medical: Not on file  Tobacco Use  . Smoking status: Never Smoker  . Smokeless tobacco: Never Used  Substance and Sexual Activity  . Alcohol use: No  . Drug use: No  . Sexual activity: Not on file  Lifestyle  . Physical activity:    Days per week: Not on file    Minutes per session: Not on file  . Stress: Not on file  Relationships  . Social connections:    Talks on phone: Not on file    Gets together: Not on file    Attends religious service: Not on file    Active member of club or organization: Not on file    Attends meetings of clubs or organizations: Not on file    Relationship status: Not on file  . Intimate partner violence:    Fear of current or ex partner: Not on file    Emotionally abused: Not on file    Physically  abused: Not on file    Forced sexual activity: Not on file  Other Topics Concern  . Not on file  Social History Narrative  . Not on file    History reviewed. No pertinent family history.  BP 137/76   Pulse 76   Ht 5\' 2"  (1.575 m)   Wt 212 lb (96.2 kg)   BMI 38.78 kg/m   Review of Systems: See HPI above.     Objective:  Physical Exam:  Gen: NAD, sitting in wheelchair.  Back: No gross deformity, scoliosis. TTP right gluteal region and laterally including trochanter.  No midline or bony TTP. ROM limited due to pain. Strength LEs 5/5 all muscle groups.   Trace MSRs in patellar tendons.  1+ left achilles, trace right achilles MSRs. Negative SLRs. Sensation diminished lateral right lower leg.  No other abnormalities - otherwise NVI distally.  Right leg: No deformity. FROM with 5/5 strength. Tenderness as noted above.  No other leg  tenderness. Sensation diminished right lower leg laterally - otherwise NVI distally. Negative logroll   Assessment & Plan:  1. Low back pain with radiation into right leg - consistent with lumbar radiculopathy.  Unfortunately not improved with prednisone dose pack.  Will go ahead with MRI to further assess.  Naproxen with norco and tizanidine as needed in meantime.

## 2017-07-06 NOTE — Assessment & Plan Note (Signed)
consistent with lumbar radiculopathy.  Unfortunately not improved with prednisone dose pack.  Will go ahead with MRI to further assess.  Naproxen with norco and tizanidine as needed in meantime.

## 2019-03-29 ENCOUNTER — Encounter (HOSPITAL_BASED_OUTPATIENT_CLINIC_OR_DEPARTMENT_OTHER): Payer: Self-pay | Admitting: Emergency Medicine

## 2019-03-29 ENCOUNTER — Emergency Department (HOSPITAL_BASED_OUTPATIENT_CLINIC_OR_DEPARTMENT_OTHER)
Admission: EM | Admit: 2019-03-29 | Discharge: 2019-03-29 | Disposition: A | Discharge status: Home / Self Care | Payer: Medicare HMO | Attending: Emergency Medicine | Admitting: Emergency Medicine

## 2019-03-29 ENCOUNTER — Other Ambulatory Visit: Payer: Self-pay

## 2019-03-29 ENCOUNTER — Emergency Department (HOSPITAL_BASED_OUTPATIENT_CLINIC_OR_DEPARTMENT_OTHER): Payer: Medicare HMO

## 2019-03-29 DIAGNOSIS — Z79899 Other long term (current) drug therapy: Secondary | ICD-10-CM | POA: Diagnosis not present

## 2019-03-29 DIAGNOSIS — Y939 Activity, unspecified: Secondary | ICD-10-CM | POA: Diagnosis not present

## 2019-03-29 DIAGNOSIS — X58XXXA Exposure to other specified factors, initial encounter: Secondary | ICD-10-CM | POA: Diagnosis not present

## 2019-03-29 DIAGNOSIS — Y999 Unspecified external cause status: Secondary | ICD-10-CM | POA: Insufficient documentation

## 2019-03-29 DIAGNOSIS — I10 Essential (primary) hypertension: Secondary | ICD-10-CM | POA: Insufficient documentation

## 2019-03-29 DIAGNOSIS — S199XXA Unspecified injury of neck, initial encounter: Secondary | ICD-10-CM | POA: Diagnosis present

## 2019-03-29 DIAGNOSIS — S161XXA Strain of muscle, fascia and tendon at neck level, initial encounter: Secondary | ICD-10-CM | POA: Diagnosis not present

## 2019-03-29 DIAGNOSIS — Y929 Unspecified place or not applicable: Secondary | ICD-10-CM | POA: Insufficient documentation

## 2019-03-29 MED ORDER — LIDOCAINE 5 % EX PTCH: 1.0000 | MEDICATED_PATCH | CUTANEOUS | 0 refills | Status: AC

## 2019-03-29 NOTE — ED Triage Notes (Signed)
L side neck pain since Sunday. Denies injury.

## 2019-03-29 NOTE — ED Notes (Signed)
ED Provider at bedside. 

## 2019-03-29 NOTE — Discharge Instructions (Addendum)
You may use Lidoderm cream, available over the counter instead of the patch.  You may continue to take tylenol, available over the counter according to label instructions as needed for pain.

## 2019-03-29 NOTE — ED Provider Notes (Signed)
[facility] EMERGENCY DEPARTMENT Provider Note   CSN: [id number] Arrival date & time: 03/29/19  0755     History Chief Complaint  Patient presents with  . Neck Pain    Mary Galloway is a 79 y.o. female.  The history is provided by medical records. No language interpreter was used.  Neck Pain  Mary Galloway is a 79 y.o. female who presents to the Emergency Department complaining of neck pain. She presents the emergency department complaining of two days of atraumatic left posterior neck pain. Pain is located in the posterior neck just lateral to the cervical spine. She states that it started Sunday afternoon. It is worse with turning her neck to the left. She reports poor sleep secondary to having difficulty getting comfortable in positioning her neck. She recalls no injuries. She denies any associated fevers, shortness of breath, nausea, vomiting, chest pain, abdominal pain, numbness, weakness. She took a Tylenol for her symptoms with no significant improvement.  She is right-hand dominant. She has a history of hypertension, no additional significant medical problems. She lives at home with her daughter.    Past Medical History:  Diagnosis Date  . Arthritis   . Hypertension     Patient Active Problem List   Diagnosis Date Noted  . Low back pain radiating to right leg 07/06/2017  . 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 Morbid obesity (HCC) 05/16/2016  . 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 Spondylolisthesis at L4-L5 level 04/28/2014  . Degenerative disc disease, lumbar 04/28/2014  . Essential hypertension 08/21/2012    Past Surgical History:  Procedure Laterality Date  . ABDOMINAL HYSTERECTOMY    . APPENDECTOMY    . KNEE SURGERY    . TONSILLECTOMY       OB History   No obstetric history on file.     No family history on file.  Social History   Tobacco Use  . Smoking status: Never Smoker  . Smokeless tobacco: Never Used   Substance Use Topics  . Alcohol use: No  . Drug use: No    Home Medications Prior to Admission medications   Medication Sig Start Date End Date Taking? Authorizing Provider  amLODipine (NORVASC) 2.5 MG tablet Take 2.5 mg by mouth daily. 04/27/17   [provider]  calcium carbonate (OS-CAL) 600 MG TABS Take 600 mg by mouth 2 (two) times daily.      [provider]  celecoxib (CELEBREX) 200 MG capsule TAKE 1 CAPSULE (200 MG TOTAL) BY MOUTH DAILY. FOLLOW UP APPT REQUIRED FOR ADDITIONAL REFILLS! 05/11/17   [provider]  Cholecalciferol (VITAMIN D3) 2000 units capsule Take by mouth.    [provider]  dorzolamide-timolol (COSOPT) 22.3-6.8 MG/ML ophthalmic solution INSTILL 1 DROP INTO BOTH EYES TWICE A DAY 06/30/17   [provider]  hydrochlorothiazide (HYDRODIURIL) 25 MG tablet TAKE 1 TABLET EVERY DAY 04/09/14   [provider]  HYDROcodone-acetaminophen (NORCO) 7.5-325 MG tablet Take 1 tablet by mouth every 6 (six) hours as needed for moderate pain. 07/06/17   Hudnall, Azucena Fallen, MD  lidocaine (LIDODERM) 5 % Place 1 patch onto the skin daily. Remove & Discard patch within 12 hours or as directed by MD 03/29/19   Tilden Fossa, MD  potassium chloride SA (K-DUR,KLOR-CON) 20 MEQ tablet Take 20 mEq by mouth 2 (two) times daily.    [provider]  quinapril (ACCUPRIL) 40 MG tablet TAKE 1 TABLET (40 MG TOTAL) BY MOUTH NIGHTLY. 04/04/17  [provider]  tiZANidine (ZANAFLEX) 4 MG tablet Take 1 tablet (4 mg total) by mouth every 6 (six) hours as needed for muscle spasms. 07/06/17   Lenda Kelp, MD    Allergies    Brimonidine, Cephalexin, and Penicillins  Review of Systems   Review of Systems  Musculoskeletal: Positive for neck pain.  All other systems reviewed and are negative.   Physical Exam Updated Vital Signs BP (!) 149/74 (BP Location: Right Arm)   Pulse 90   Temp 98.4 F (36.9 C) (Oral)   Resp 18   Ht 5'  1.5" (1.562 m)   Wt 96.2 kg   SpO2 99%   BMI 39.41 kg/m   Physical Exam Vitals and nursing note reviewed.  Constitutional:      Appearance: She is well-developed.  HENT:     Head: Normocephalic and atraumatic.     Right Ear: Tympanic membrane normal.     Left Ear: Tympanic membrane normal.  Neck:     Comments: No midline cervical tenderness to palpation. There is some paraspinal is tenderness on the left cervical region. There is no tenderness to palpation in this area. There is no pathologic lymphadenopathy in this region. No overlying rash. Range of motion is intact in the neck but pain is reproduced on rotation to the left and right.  No carotid bruit. Cardiovascular:     Rate and Rhythm: Normal rate and regular rhythm.     Heart sounds: No murmur.  Pulmonary:     Effort: Pulmonary effort is normal. No respiratory distress.     Breath sounds: Normal breath sounds.  Abdominal:     Palpations: Abdomen is soft.     Tenderness: There is no abdominal tenderness. There is no guarding or rebound.  Musculoskeletal:        General: No swelling or tenderness.     Cervical back: Normal range of motion.     Comments: 2+ radial pulses bilaterally  Skin:    General: Skin is warm and dry.  Neurological:     Mental Status: She is alert and oriented to person, place, and time.     Comments: Five out of five strength in bilateral upper extremities and proximal and distal muscle groups. Sensation to light touch intact in bilateral upper extremities.  Psychiatric:        Behavior: Behavior normal.     ED Results / Procedures / Treatments   Labs (all labs ordered are listed, but only abnormal results are displayed) Labs Reviewed - No data to display  EKG EKG Interpretation  Date/Time:  Tuesday March 29 2019 09:12:22 EST Ventricular Rate:  81 PR Interval:    QRS Duration: 79 QT Interval:  408 QTC Calculation: 474 R Axis:   -10 Text Interpretation: Sinus rhythm Consider left  atrial enlargement Low voltage, precordial leads Left ventricular hypertrophy Borderline T abnormalities, lateral leads no prior available for comparison Confirmed by Tilden Fossa [phone]) on 03/29/2019 9:17:05 AM   Radiology DG Cervical Spine Complete  Result Date: 03/29/2019 CLINICAL DATA:  Left neck pain EXAM: CERVICAL SPINE - COMPLETE 4+ VIEW COMPARISON:  2013 FINDINGS: There is no prevertebral soft tissue swelling. No significant listhesis. Significant disc height loss and endplate osteophytes primarily from C4-C5 through C6-C7. Facet and uncovertebral hypertrophy contribute to multilevel osseous encroachment on the neural foramina. IMPRESSION: Advanced multilevel cervical spondylosis appears slightly increased compared to 2013. Electronically Signed   By: Guadlupe Spanish M.D.   On: 03/29/2019 09:07  Procedures Procedures (including critical care time)  Medications Ordered in ED Medications - No data to display  ED Course  I have reviewed the triage vital signs and the nursing notes.  Pertinent labs & imaging results that were available during my care of the patient were reviewed by me and considered in my medical decision making (see chart for details).    MDM Rules/Calculators/A&P                     Patient here for evaluation of left sided posterior neck pain. She is non-toxic appearing on evaluation and neurovascular early intact. Presentation is not consistent with dissection, ACS, epidural abscess. Discussed with patient home care for neck pain. Discussed outpatient follow-up as well as return precautions.   Final Clinical Impression(s) / ED Diagnoses Final diagnoses:  Strain of neck muscle, initial encounter    Rx / DC Orders ED Discharge Orders         Ordered    lidocaine (LIDODERM) 5 %  Every 24 hours     03/29/19 0921           Quintella Reichert, MD 03/29/19 [phone]

## 2019-08-12 ENCOUNTER — Emergency Department (HOSPITAL_BASED_OUTPATIENT_CLINIC_OR_DEPARTMENT_OTHER)
Admission: EM | Admit: 2019-08-12 | Discharge: 2019-08-12 | Disposition: A | Discharge status: Home / Self Care | Payer: Medicare HMO | Attending: Emergency Medicine | Admitting: Emergency Medicine

## 2019-08-12 ENCOUNTER — Emergency Department (HOSPITAL_BASED_OUTPATIENT_CLINIC_OR_DEPARTMENT_OTHER): Payer: Medicare HMO

## 2019-08-12 ENCOUNTER — Other Ambulatory Visit: Payer: Self-pay

## 2019-08-12 ENCOUNTER — Encounter (HOSPITAL_BASED_OUTPATIENT_CLINIC_OR_DEPARTMENT_OTHER): Payer: Self-pay | Admitting: *Deleted

## 2019-08-12 DIAGNOSIS — I10 Essential (primary) hypertension: Secondary | ICD-10-CM | POA: Insufficient documentation

## 2019-08-12 DIAGNOSIS — M542 Cervicalgia: Secondary | ICD-10-CM | POA: Insufficient documentation

## 2019-08-12 DIAGNOSIS — G43909 Migraine, unspecified, not intractable, without status migrainosus: Secondary | ICD-10-CM | POA: Diagnosis not present

## 2019-08-12 DIAGNOSIS — Z880 Allergy status to penicillin: Secondary | ICD-10-CM | POA: Diagnosis not present

## 2019-08-12 DIAGNOSIS — R519 Headache, unspecified: Secondary | ICD-10-CM

## 2019-08-12 DIAGNOSIS — M7918 Myalgia, other site: Secondary | ICD-10-CM | POA: Insufficient documentation

## 2019-08-12 DIAGNOSIS — Z882 Allergy status to sulfonamides status: Secondary | ICD-10-CM | POA: Diagnosis not present

## 2019-08-12 DIAGNOSIS — Z79899 Other long term (current) drug therapy: Secondary | ICD-10-CM | POA: Insufficient documentation

## 2019-08-12 MED ORDER — SODIUM CHLORIDE 0.9 % IV SOLN: Freq: Once | INTRAVENOUS | Status: DC

## 2019-08-12 MED ORDER — ACETAMINOPHEN 500 MG PO TABS
1000.0000 mg | ORAL_TABLET | Freq: Once | ORAL | Status: AC
  Administered 2019-08-12: 1000 mg via ORAL
  Filled 2019-08-12: qty 2

## 2019-08-12 MED ORDER — METHOCARBAMOL 500 MG PO TABS: 500.0000 mg | ORAL_TABLET | Freq: Two times a day (BID) | ORAL | 0 refills | Status: AC

## 2019-08-12 NOTE — Discharge Instructions (Addendum)
The pain you are experiencing is likely due to muscle strain, you may take Tylenol and Robaxin as needed for pain management.  You may also use ice and heat, and over-the-counter remedies such as Biofreeze gel or salon pas lidocaine patches. The muscle soreness should improve over the next week. Follow up with your family doctor in the next week for a recheck if you are still having symptoms. Return to ED if pain is worsening, you develop weakness or numbness of extremities, or new or concerning symptoms develop.
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
## 2021-01-31 ENCOUNTER — Encounter (HOSPITAL_BASED_OUTPATIENT_CLINIC_OR_DEPARTMENT_OTHER): Payer: Self-pay

## 2021-01-31 ENCOUNTER — Emergency Department (HOSPITAL_BASED_OUTPATIENT_CLINIC_OR_DEPARTMENT_OTHER)
Admission: EM | Admit: 2021-01-31 | Discharge: 2021-01-31 | Disposition: A | Discharge status: Home / Self Care | Payer: Medicare HMO | Attending: Emergency Medicine | Admitting: Emergency Medicine

## 2021-01-31 ENCOUNTER — Emergency Department (HOSPITAL_BASED_OUTPATIENT_CLINIC_OR_DEPARTMENT_OTHER): Payer: Medicare HMO

## 2021-01-31 ENCOUNTER — Other Ambulatory Visit: Payer: Self-pay

## 2021-01-31 DIAGNOSIS — Z20822 Contact with and (suspected) exposure to covid-19: Secondary | ICD-10-CM | POA: Diagnosis not present

## 2021-01-31 DIAGNOSIS — Z79899 Other long term (current) drug therapy: Secondary | ICD-10-CM | POA: Diagnosis not present

## 2021-01-31 DIAGNOSIS — J3489 Other specified disorders of nose and nasal sinuses: Secondary | ICD-10-CM | POA: Insufficient documentation

## 2021-01-31 DIAGNOSIS — R059 Cough, unspecified: Secondary | ICD-10-CM

## 2021-01-31 DIAGNOSIS — J069 Acute upper respiratory infection, unspecified: Secondary | ICD-10-CM | POA: Diagnosis not present

## 2021-01-31 DIAGNOSIS — R0981 Nasal congestion: Secondary | ICD-10-CM | POA: Diagnosis present

## 2021-01-31 DIAGNOSIS — I10 Essential (primary) hypertension: Secondary | ICD-10-CM | POA: Insufficient documentation

## 2021-01-31 LAB — RESP PANEL BY RT-PCR (FLU A&B, COVID) ARPGX2
Influenza A by PCR: NEGATIVE
Influenza B by PCR: NEGATIVE
SARS Coronavirus 2 by RT PCR: NEGATIVE

## 2021-01-31 MED ORDER — DOXYCYCLINE HYCLATE 100 MG PO CAPS: 100.0000 mg | ORAL_CAPSULE | Freq: Two times a day (BID) | ORAL | 0 refills | Status: AC

## 2021-01-31 MED ORDER — BENZONATATE 100 MG PO CAPS: 100.0000 mg | ORAL_CAPSULE | Freq: Every day | ORAL | 0 refills | Status: AC

## 2021-01-31 NOTE — ED Notes (Signed)
Portable Xray at bedside.

## 2021-01-31 NOTE — ED Triage Notes (Signed)
Pt presents to ED from home C/O sinus pressure, nasal congestion, sore throat since Tuesday.

## 2021-01-31 NOTE — ED Provider Notes (Signed)
[facility] EMERGENCY DEPARTMENT Provider Note   CSN: [id number] Arrival date & time: 01/31/21  7829     History Chief Complaint  Patient presents with   Nasal Congestion    Mary Galloway is a 80 y.o. female.  HPI  80 year old female with past medical history of HTN presents emergency department with sinus pain, congestion, thick green snot and intermittently productive cough.  Patient states her symptoms have been going on close to a week and worsening.  Endorses intermittent fever and chills at home.  No active chest pain or back pain.  No GI symptoms.  No swelling of her lower extremities.  No recent sick contacts.  Has been using over-the-counter medication without significant relief.  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     No family history on file.  Social History   Tobacco Use   Smoking status: Never   Smokeless tobacco: Never  Substance Use Topics   Alcohol use: No   Drug use: No    Home Medications Prior to Admission medications   Medication Sig Start Date End Date Taking? Authorizing Provider  benzonatate (TESSALON) 100 MG capsule Take 1 capsule (100 mg total) by mouth at bedtime. 01/31/21  Yes Asencion Guisinger, Clabe Seal, DO  doxycycline (VIBRAMYCIN) 100 MG capsule Take 1 capsule (100 mg total) by mouth 2 (two) times daily. 01/31/21  Yes Kreston Ahrendt, Clabe Seal, DO   amLODipine (NORVASC) 2.5 MG tablet Take 2.5 mg by mouth daily. 04/27/17   [provider]  calcium carbonate (OS-CAL) 600 MG TABS Take 600 mg by mouth 2 (two) times daily.      [provider]  celecoxib (CELEBREX) 200 MG capsule TAKE 1 CAPSULE (200 MG TOTAL) BY MOUTH DAILY. FOLLOW UP APPT REQUIRED FOR ADDITIONAL REFILLS! 05/11/17   [provider]  Cholecalciferol (VITAMIN D3) 2000 units capsule Take by mouth.    [provider]  dorzolamide-timolol (COSOPT) 22.3-6.8 MG/ML ophthalmic solution INSTILL 1 DROP INTO BOTH EYES TWICE A DAY 06/30/17   [provider]  hydrochlorothiazide (HYDRODIURIL) 25 MG tablet TAKE 1 TABLET EVERY DAY 04/09/14   [provider]  HYDROcodone-acetaminophen (NORCO) 7.5-325 MG tablet Take 1 tablet by mouth every 6 (six) hours as needed for moderate pain. 07/06/17   Hudnall, Azucena Fallen, MD  lidocaine (LIDODERM) 5 % Place 1 patch onto the skin daily. Remove & Discard patch within 12 hours or as directed by MD 03/29/19   Tilden Fossa, MD  methocarbamol (ROBAXIN) 500 MG tablet Take 1 tablet (500 mg total) by mouth 2 (two) times daily. 08/12/19   Dartha Lodge, PA-C  potassium chloride SA (K-DUR,KLOR-CON) 20 MEQ tablet Take 20 mEq by mouth 2 (two) times daily.    [provider]  quinapril (ACCUPRIL) 40 MG tablet TAKE 1 TABLET (40 MG TOTAL) BY  MOUTH NIGHTLY. 04/04/17   [provider]  tiZANidine (ZANAFLEX) 4 MG tablet Take 1 tablet (4 mg total) by mouth every 6 (six) hours as needed for muscle spasms. 07/06/17   Lenda Kelp, MD    Allergies    Brimonidine, Cephalexin, and Penicillins  Review of Systems   Review of Systems  Constitutional:  Positive for chills, fatigue and fever.  HENT:  Positive for postnasal drip, rhinorrhea, sinus pressure, sinus pain and sore throat. Negative for congestion and ear pain.   Respiratory:  Positive for cough. Negative for shortness of breath.   Cardiovascular:   Negative for chest pain, palpitations and leg swelling.  Gastrointestinal:  Negative for abdominal pain, diarrhea and vomiting.  Genitourinary:  Negative for dysuria.  Musculoskeletal:  Negative for back pain.  Skin:  Negative for rash.  Neurological:  Negative for headaches.   Physical Exam Updated Vital Signs BP 132/80   Pulse 74   Temp 98.8 F (37.1 C) (Oral)   Resp 18   Ht 5' 1.5" (1.562 m)   Wt 96.2 kg   SpO2 96%   BMI 39.41 kg/m   Physical Exam Vitals and nursing note reviewed.  Constitutional:      General: She is not in acute distress.    Appearance: Normal appearance. She is not diaphoretic.  HENT:     Head: Normocephalic.     Nose: Congestion and rhinorrhea present.     Mouth/Throat:     Mouth: Mucous membranes are moist.     Pharynx: Posterior oropharyngeal erythema present. No oropharyngeal exudate.  Eyes:     Pupils: Pupils are equal, round, and reactive to light.  Cardiovascular:     Rate and Rhythm: Normal rate.  Pulmonary:     Effort: Pulmonary effort is normal. No respiratory distress.     Comments: Wet cough with deep inspiration Abdominal:     Palpations: Abdomen is soft.     Tenderness: There is no abdominal tenderness.  Musculoskeletal:        General: No swelling.  Skin:    General: Skin is warm.  Neurological:     Mental Status: She is alert and oriented to person, place, and time. Mental status is at baseline.  Psychiatric:        Mood and Affect: Mood normal.    ED Results / Procedures / Treatments   Labs (all labs ordered are listed, but only abnormal results are displayed) Labs Reviewed  RESP PANEL BY RT-PCR (FLU A&B, COVID) [id number]    EKG None  Radiology DG Chest Port 1 View  Result Date: 01/31/2021 CLINICAL DATA:  Sinus pressure, nasal congestion, sore throat EXAM: PORTABLE CHEST 1 VIEW COMPARISON:  2013 FINDINGS: The cardiomediastinal silhouette is within normal limits. No pleural effusion. No pneumothorax. No mass or  consolidation. No acute osseous abnormality. IMPRESSION: No acute abnormality in the chest. Electronically Signed   By: Olive Bass M.D.   On: 01/31/2021 08:44    Procedures Procedures   Medications Ordered in ED Medications - No data to display  ED Course  I have reviewed the triage vital signs and the nursing notes.  Pertinent labs & imaging results that were available during my care of the patient were reviewed by me and considered in my medical decision making (see chart for details).    MDM Rules/Calculators/A&P  80 year old female presents emergency department with sinus pressure/thick green snot drainage, intermittently productive cough with fever and chills.  Afebrile here, vitals are stable.  She has a wet cough, nasal congestion and sinus discomfort on exam.  She is flu and COVID-negative, chest x-ray shows no pneumonia.  Given the duration of her symptoms and presentation plan to treat for bacterial sinusitis/upper respiratory infection.  Patient at this time appears safe and stable for discharge and will be treated as an outpatient.  Discharge plan and strict return to ED precautions discussed, patient verbalizes understanding and agreement.  Final Clinical Impression(s) / ED Diagnoses Final diagnoses:  Upper respiratory tract infection, unspecified type    Rx / DC Orders ED Discharge Orders          Ordered    doxycycline (VIBRAMYCIN) 100 MG capsule  2 times daily        01/31/21 0929    benzonatate (TESSALON) 100 MG capsule  Daily at bedtime        01/31/21 0929             Rozelle Logan, DO 01/31/21 0109

## 2021-01-31 NOTE — Discharge Instructions (Signed)
You have been seen and discharged from the emergency department.  Take antibiotic as directed.  Stay well-hydrated.  Use Tylenol and ibuprofen as needed for pain control.  Take cough medicine at night.  Follow-up with your primary provider for reevaluation and further care. Take home medications as prescribed. If you have any worsening symptoms or further concerns for your health please return to an emergency department for further evaluation.

## 2023-09-22 ENCOUNTER — Other Ambulatory Visit: Payer: Self-pay

## 2023-09-22 ENCOUNTER — Emergency Department (HOSPITAL_BASED_OUTPATIENT_CLINIC_OR_DEPARTMENT_OTHER)
Admission: EM | Admit: 2023-09-22 | Discharge: 2023-09-22 | Disposition: A | Discharge status: Home / Self Care | Attending: Emergency Medicine | Admitting: Emergency Medicine

## 2023-09-22 ENCOUNTER — Emergency Department (HOSPITAL_BASED_OUTPATIENT_CLINIC_OR_DEPARTMENT_OTHER)

## 2023-09-22 ENCOUNTER — Encounter (HOSPITAL_BASED_OUTPATIENT_CLINIC_OR_DEPARTMENT_OTHER): Payer: Self-pay | Admitting: Emergency Medicine

## 2023-09-22 DIAGNOSIS — R140 Abdominal distension (gaseous): Secondary | ICD-10-CM

## 2023-09-22 DIAGNOSIS — M25551 Pain in right hip: Secondary | ICD-10-CM | POA: Insufficient documentation

## 2023-09-22 DIAGNOSIS — M25552 Pain in left hip: Secondary | ICD-10-CM | POA: Diagnosis not present

## 2023-09-22 DIAGNOSIS — Z79899 Other long term (current) drug therapy: Secondary | ICD-10-CM | POA: Insufficient documentation

## 2023-09-22 DIAGNOSIS — R600 Localized edema: Secondary | ICD-10-CM | POA: Insufficient documentation

## 2023-09-22 DIAGNOSIS — K5900 Constipation, unspecified: Secondary | ICD-10-CM | POA: Insufficient documentation

## 2023-09-22 DIAGNOSIS — I10 Essential (primary) hypertension: Secondary | ICD-10-CM | POA: Insufficient documentation

## 2023-09-22 DIAGNOSIS — R0602 Shortness of breath: Secondary | ICD-10-CM | POA: Insufficient documentation

## 2023-09-22 DIAGNOSIS — M545 Low back pain, unspecified: Secondary | ICD-10-CM | POA: Insufficient documentation

## 2023-09-22 DIAGNOSIS — R0609 Other forms of dyspnea: Secondary | ICD-10-CM | POA: Insufficient documentation

## 2023-09-22 LAB — CBC WITH DIFFERENTIAL/PLATELET
Abs Immature Granulocytes: 0.03 K/uL (ref 0.00–0.07)
Basophils Absolute: 0 K/uL (ref 0.0–0.1)
Basophils Relative: 0 %
Eosinophils Absolute: 0 K/uL (ref 0.0–0.5)
Eosinophils Relative: 1 %
HCT: 39.6 % (ref 36.0–46.0)
Hemoglobin: 13.3 g/dL (ref 12.0–15.0)
Immature Granulocytes: 0 %
Lymphocytes Relative: 32 %
Lymphs Abs: 2.4 K/uL (ref 0.7–4.0)
MCH: 30.4 pg (ref 26.0–34.0)
MCHC: 33.6 g/dL (ref 30.0–36.0)
MCV: 90.6 fL (ref 80.0–100.0)
Monocytes Absolute: 0.8 K/uL (ref 0.1–1.0)
Monocytes Relative: 10 %
Neutro Abs: 4.3 K/uL (ref 1.7–7.7)
Neutrophils Relative %: 57 %
Platelets: 250 K/uL (ref 150–400)
RBC: 4.37 MIL/uL (ref 3.87–5.11)
RDW: 13.9 % (ref 11.5–15.5)
WBC: 7.5 K/uL (ref 4.0–10.5)
nRBC: 0 % (ref 0.0–0.2)

## 2023-09-22 LAB — RESP PANEL BY RT-PCR (RSV, FLU A&B, COVID)  RVPGX2
Influenza A by PCR: NEGATIVE
Influenza B by PCR: NEGATIVE
Resp Syncytial Virus by PCR: NEGATIVE
SARS Coronavirus 2 by RT PCR: NEGATIVE

## 2023-09-22 LAB — PRO BRAIN NATRIURETIC PEPTIDE: Pro Brain Natriuretic Peptide: 43.4 pg/mL (ref <300.0)

## 2023-09-22 LAB — TSH: TSH: 2.55 u[IU]/mL (ref 0.350–4.500)

## 2023-09-22 LAB — COMPREHENSIVE METABOLIC PANEL WITH GFR
ALT: 10 U/L (ref 0–44)
AST: 17 U/L (ref 15–41)
Albumin: 4.4 g/dL (ref 3.5–5.0)
Alkaline Phosphatase: 60 U/L (ref 38–126)
Anion gap: 11 (ref 5–15)
BUN: 17 mg/dL (ref 8–23)
CO2: 27 mmol/L (ref 22–32)
Calcium: 10 mg/dL (ref 8.9–10.3)
Chloride: 101 mmol/L (ref 98–111)
Creatinine, Ser: 0.88 mg/dL (ref 0.44–1.00)
GFR, Estimated: >60 mL/min (ref >60)
Glucose, Bld: 118 mg/dL — ABNORMAL HIGH (ref 70–99)
Potassium: 3.4 mmol/L — ABNORMAL LOW (ref 3.5–5.1)
Sodium: 140 mmol/L (ref 135–145)
Total Bilirubin: 0.4 mg/dL (ref 0.0–1.2)
Total Protein: 7 g/dL (ref 6.5–8.1)

## 2023-09-22 LAB — URINALYSIS, W/ REFLEX TO CULTURE (INFECTION SUSPECTED)
Bacteria, UA: NONE SEEN
Bilirubin Urine: NEGATIVE
Glucose, UA: NEGATIVE mg/dL
Hgb urine dipstick: NEGATIVE
Ketones, ur: NEGATIVE mg/dL
Leukocytes,Ua: NEGATIVE
Nitrite: NEGATIVE
Protein, ur: NEGATIVE mg/dL
RBC / HPF: NONE SEEN RBC/hpf (ref 0–5)
Specific Gravity, Urine: 1.015 (ref 1.005–1.030)
pH: 7 (ref 5.0–8.0)

## 2023-09-22 LAB — TROPONIN T, HIGH SENSITIVITY: Troponin T High Sensitivity: <15 ng/L (ref <19)

## 2023-09-22 LAB — LIPASE, BLOOD: Lipase: 24 U/L (ref 11–51)

## 2023-09-22 LAB — LACTIC ACID, PLASMA: Lactic Acid, Venous: 1.4 mmol/L (ref 0.5–1.9)

## 2023-09-22 MED ORDER — FENTANYL CITRATE PF 50 MCG/ML IJ SOSY
50.0000 ug | PREFILLED_SYRINGE | Freq: Once | INTRAMUSCULAR | Status: AC
  Administered 2023-09-22: 50 ug via INTRAVENOUS
  Filled 2023-09-22: qty 1

## 2023-09-22 MED ORDER — CYCLOBENZAPRINE HCL 5 MG PO TABS: 2.5000 mg | ORAL_TABLET | Freq: Three times a day (TID) | ORAL | 0 refills | Status: AC | PRN

## 2023-09-22 MED ORDER — LIDOCAINE 5 % EX PTCH: 1.0000 | MEDICATED_PATCH | CUTANEOUS | 0 refills | Status: AC

## 2023-09-22 MED ORDER — OXYCODONE-ACETAMINOPHEN 5-325 MG PO TABS
1.0000 | ORAL_TABLET | Freq: Once | ORAL | Status: AC
  Administered 2023-09-22: 1 via ORAL
  Filled 2023-09-22: qty 1

## 2023-09-22 MED ORDER — IOHEXOL 300 MG/ML  SOLN
100.0000 mL | Freq: Once | INTRAMUSCULAR | Status: AC | PRN
  Administered 2023-09-22: 100 mL via INTRAVENOUS

## 2023-09-22 NOTE — ED Triage Notes (Signed)
 Intermittent left hip pain since July 1st  then to right hip . Was seen and treated for bilateral leg swelling , was given Lasix once .  Shortness of breath with exertion .  Pain to hips affecting her ADL  per daughter witch  is new .

## 2023-09-22 NOTE — Discharge Instructions (Addendum)
 Please follow up with spine surgery. They can obtain MRI back for you if you continue to have pain. If you have any bowel/bladder incontinence then come back to the ED.   Please reach out to your PCP to see if they can set you up with resources for OT/PT outpatient. Please give them a call.

## 2023-09-22 NOTE — ED Provider Notes (Signed)
  Physical Exam  BP (!) 150/67   Pulse 78   Temp 98.1 F (36.7 C) (Oral)   Resp (!) 24   Wt 102.7 kg   SpO2 100%   BMI 42.10 kg/m   Physical Exam  Procedures  Procedures  ED Course / MDM    Medical Decision Making Amount and/or Complexity of Data Reviewed Labs: ordered. Radiology: ordered.  Risk Prescription drug management.    Received signout from previous provider.  Patient is a 83 year old female.  Complained about worsening lower extremity edema.pain in both of her hips.  DVT ultrasounds been negative.  CT scan of the abdomen.  X-ray showed no acute range compared to prior exam.   The abdomen did not show any kind of acute pathology.does have degenerative changes of bilateral hips.  Cholelithiasis but patient has no pain to palpation of right upper quadrant. Lfts normal. Normal bilirubin.    I do think most of her pain is musculoskeletal given degenerative changes in the bilateral hips as well as pain in the lumbosacral area to palpation.  I do think she should follow-up with spine surgery outpatient given I think a lot of this is referred pain from the lumbar sacral area.  Recommended PT and OT.  She will follow-up with PCP about possible home health as well.   Simon Lavonia SAILOR, MD 09/22/23 [phone]

## 2023-09-22 NOTE — ED Provider Notes (Signed)
 [place] EMERGENCY DEPARTMENT AT [facility] [place] Provider Note   CSN: [id number] Arrival date & time: 09/22/23  9047     Patient presents with: No chief complaint on file.   Margarethe Bober is a 83 y.o. female.   The history is provided by the patient, medical records and a relative. No language interpreter was used.  Hip Pain This is a new problem. The current episode started more than 2 days ago. The problem occurs constantly. The problem has been gradually worsening. Associated symptoms include shortness of breath. Pertinent negatives include no chest pain, no abdominal pain and no headaches. Nothing aggravates the symptoms. Nothing relieves the symptoms. She has tried nothing for the symptoms. The treatment provided no relief.       Prior to Admission medications   Medication Sig Start Date End Date Taking? Authorizing Provider  amLODipine (NORVASC) 2.5 MG tablet Take 2.5 mg by mouth daily. 04/27/17   [provider]  benzonatate  (TESSALON ) 100 MG capsule Take 1 capsule (100 mg total) by mouth at bedtime. 01/31/21   Horton, Kristie M, DO  calcium carbonate (OS-CAL) 600 MG TABS Take 600 mg by mouth 2 (two) times daily.      [provider]  celecoxib (CELEBREX) 200 MG capsule TAKE 1 CAPSULE (200 MG TOTAL) BY MOUTH DAILY. FOLLOW UP APPT REQUIRED FOR ADDITIONAL REFILLS! 05/11/17   [provider]  Cholecalciferol (VITAMIN D3) 2000 units capsule Take by mouth.    [provider]  dorzolamide-timolol (COSOPT) 22.3-6.8 MG/ML ophthalmic solution INSTILL 1 DROP INTO BOTH EYES TWICE A DAY 06/30/17   [provider]  doxycycline  (VIBRAMYCIN ) 100 MG capsule Take 1 capsule (100 mg total) by mouth 2 (two) times daily. 01/31/21   Horton, Kristie M, DO  hydrochlorothiazide (HYDRODIURIL) 25 MG tablet TAKE 1 TABLET EVERY DAY 04/09/14   [provider]  HYDROcodone -acetaminophen  (NORCO) 7.5-325 MG tablet Take 1 tablet by mouth every 6 (six)  hours as needed for moderate pain. 07/06/17   Hudnall, Ludie SAUNDERS, MD  lidocaine  (LIDODERM ) 5 % Place 1 patch onto the skin daily. Remove & Discard patch within 12 hours or as directed by MD 03/29/19   Griselda Norris, MD  methocarbamol  (ROBAXIN ) 500 MG tablet Take 1 tablet (500 mg total) by mouth 2 (two) times daily. 08/12/19   Kehrli, Kelsey F, PA-C  potassium chloride SA (K-DUR,KLOR-CON) 20 MEQ tablet Take 20 mEq by mouth 2 (two) times daily.    [provider]  quinapril (ACCUPRIL) 40 MG tablet TAKE 1 TABLET (40 MG TOTAL) BY MOUTH NIGHTLY. 04/04/17   [provider]  tiZANidine  (ZANAFLEX ) 4 MG tablet Take 1 tablet (4 mg total) by mouth every 6 (six) hours as needed for muscle spasms. 07/06/17   Cleatrice Ludie SAUNDERS, MD    Allergies: Brimonidine, Cephalexin, and Penicillins    Review of Systems  Constitutional:  Positive for fatigue. Negative for chills and fever.  HENT:  Negative for congestion.   Eyes:  Negative for visual disturbance.  Respiratory:  Positive for chest tightness and shortness of breath. Negative for cough, wheezing and stridor.   Cardiovascular:  Positive for leg swelling. Negative for chest pain and palpitations.  Gastrointestinal:  Positive for constipation. Negative for abdominal pain, diarrhea, nausea and vomiting.  Genitourinary:  Negative for dysuria and flank pain.  Musculoskeletal:  Positive for back pain. Negative for neck pain and neck stiffness.  Skin:  Negative for rash and wound.  Neurological:  Negative for speech difficulty, weakness,  light-headedness, numbness and headaches.  Psychiatric/Behavioral:  Negative for agitation and confusion.   All other systems reviewed and are negative.   Updated Vital Signs BP (!) 150/67   Pulse 78   Temp 98.1 F (36.7 C) (Oral)   Resp (!) 24   Wt 102.7 kg   SpO2 100%   BMI 42.10 kg/m   Physical Exam Vitals and nursing note reviewed.  Constitutional:      General: She is not in acute distress.     Appearance: She is well-developed. She is not ill-appearing, toxic-appearing or diaphoretic.  HENT:     Head: Normocephalic and atraumatic.     Nose: No congestion or rhinorrhea.     Mouth/Throat:     Mouth: Mucous membranes are moist.     Pharynx: No oropharyngeal exudate or posterior oropharyngeal erythema.  Eyes:     Extraocular Movements: Extraocular movements intact.     Conjunctiva/sclera: Conjunctivae normal.     Pupils: Pupils are equal, round, and reactive to light.  Cardiovascular:     Rate and Rhythm: Normal rate and regular rhythm.     Pulses: Normal pulses.     Heart sounds: No murmur heard. Pulmonary:     Effort: Pulmonary effort is normal. No respiratory distress.     Breath sounds: Normal breath sounds. No wheezing, rhonchi or rales.  Chest:     Chest wall: No tenderness.  Abdominal:     General: Abdomen is flat.     Palpations: Abdomen is soft.     Tenderness: There is no abdominal tenderness. There is no right CVA tenderness, left CVA tenderness, guarding or rebound.  Musculoskeletal:        General: Tenderness present. No swelling.     Cervical back: Neck supple.     Right lower leg: Edema present.     Left lower leg: Edema present.  Skin:    General: Skin is warm and dry.     Capillary Refill: Capillary refill takes less than 2 seconds.     Findings: No erythema or rash.  Neurological:     General: No focal deficit present.     Mental Status: She is alert.     Sensory: No sensory deficit.     Motor: No weakness.  Psychiatric:        Mood and Affect: Mood normal.     (all labs ordered are listed, but only abnormal results are displayed) Labs Reviewed  COMPREHENSIVE METABOLIC PANEL WITH GFR - Abnormal; Notable for the following components:      Result Value   Potassium 3.4 (*)    Glucose, Bld 118 (*)    All other components within normal limits  RESP PANEL BY RT-PCR (RSV, FLU A&B, COVID)  RVPGX2  CBC WITH DIFFERENTIAL/PLATELET  LIPASE, BLOOD   LACTIC ACID, PLASMA  URINALYSIS, W/ REFLEX TO CULTURE (INFECTION SUSPECTED)  PRO BRAIN NATRIURETIC PEPTIDE  TSH  TROPONIN T, HIGH SENSITIVITY    EKG: EKG Interpretation Date/Time:  Tuesday September 22 2023 11:05:02 EDT Ventricular Rate:  74 PR Interval:  208 QRS Duration:  73 QT Interval:  372 QTC Calculation: 413 R Axis:   -19  Text Interpretation: Sinus rhythm Consider right atrial enlargement Low voltage, precordial leads LVH with secondary repolarization abnormality Anterior Q waves, possibly due to LVH when compared to prior, similar appearance No STEMI Confirmed by Ginger Barefoot ([id number]) on 09/22/2023 11:11:44 AM  Radiology: [facility] Chest 2 View Result Date: 09/22/2023 CLINICAL DATA:  Shortness of breath.  Edema. EXAM: CHEST - 2 VIEW COMPARISON:  09/15/2023. FINDINGS: Low lung volumes. The heart size and mediastinal contours are unchanged. Bibasilar atelectatic changes, more pronounced on the left. No acute airspace opacity or overt pulmonary edema. No pleural effusion or pneumothorax. Degenerative changes of the thoracic spine. No acute osseous abnormality. IMPRESSION: No significant change compared to the prior exam. Low lung volumes with bibasilar atelectasis, more pronounced on the left. Electronically Signed   By: Harrietta Sherry M.D.   On: 09/22/2023 13:08     Procedures   Medications Ordered in the ED  fentaNYL  (SUBLIMAZE ) injection 50 mcg (50 mcg Intravenous Given 09/22/23 1109)  iohexol  (OMNIPAQUE ) 300 MG/ML solution 100 mL (100 mLs Intravenous Contrast Given 09/22/23 1255)                                    Medical Decision Making Amount and/or Complexity of Data Reviewed Labs: ordered. Radiology: ordered.  Risk Prescription drug management.    Sandra Hallstrom is a 83 y.o. female with a past medical history significant for hypertension, obesity, degenerative disc disease in her lumbar spine, previous appendectomy, previous hysterectomy, and previous knee surgery who  presents with 1 week of worsening bilateral hip pain, peripheral edema in both legs, abdominal bloating, constipation, low back pain, and exertional shortness of breath.  Patient reports that a year ago she had some Lasix given to her for some mild edema but this is much worse than that.  She says that she was seen a week ago at [facility] had some workup but was told it was reassuring and was given a dose of Lasix.  Patient says that her peripheral edema is worsening in both legs and now she is having severe, 10 out of 10 pain in her hips.  She reports no numbness or weakness themselves but is getting extremely short of breath and winded with only taking a few steps.  This is a new problem for her.  She denies history of heart failure has not had edema like this.  She denies history of blood clots and reports that she has had previous DVT ultrasounds that were negative.  Has not had an recently.  She reports no abdominal pain but does report is bloated and she has had constipation.  She reports the pain across her low back is moderate to severe as well.  She denies any shooting pain down the legs.  She denies any chest pain or palpitations but has a shortness of breath.  Denies any fevers, chills, or cough.  Denies nausea or vomiting at this time.  On exam, lungs had rales and rhonchi but no significant wheezing.  Chest was nontender.  Abdomen was nontender and was somewhat distended.  I did hear bowel sounds.  Flanks not focally tender but she did have some tenderness across her low back.  CVA areas were nontender for me.  Midline was not tender.  She did have pain with hip movement but external palpation of the hips was reassuring.  She did have edema in both legs but had intact pulses.  Intact sensation and strength as well.  Given his new peripheral edema, abdominal bloating, and exertional shortness of breath with rales I am somewhat concerned about new heart failure.  Will get cardiac workup and  chest x-ray.  Will get DVT ultrasound as well given the edema in the legs.  Will get CT abdomen  pelvis to both look at her low back but also look at the hips and with the constipation and abdominal bloating make sure there is not a partial obstruction.  Will give some pain medicine while we wait for workup.  Anticipate reassessment after workup to determine disposition.  Care transferred to oncoming team to wait for CT results and reassessment.  If workup reassuring, anticipate discharge home for outpatient follow-up.       Final diagnoses:  Acute pain of both hips  Acute low back pain without sciatica, unspecified back pain laterality  Exertional shortness of breath  Constipation, unspecified constipation type  Abdominal bloating     Clinical Impression: 1. Acute pain of both hips   2. Acute low back pain without sciatica, unspecified back pain laterality   3. Exertional shortness of breath   4. Constipation, unspecified constipation type   5. Abdominal bloating     Disposition: Care transferred oncoming team to wait for workup results and reassessment.  Anticipate discharge if workup reassuring.  This note was prepared with assistance of Conservation officer, historic buildings. Occasional wrong-word or sound-a-like substitutions may have occurred due to the inherent limitations of voice recognition software.       Antoinette Haskett, Lonni PARAS, MD 09/22/23 1515

## 2024-01-25 ENCOUNTER — Other Ambulatory Visit: Payer: Self-pay

## 2024-01-25 ENCOUNTER — Emergency Department (HOSPITAL_BASED_OUTPATIENT_CLINIC_OR_DEPARTMENT_OTHER)

## 2024-01-25 ENCOUNTER — Encounter (HOSPITAL_BASED_OUTPATIENT_CLINIC_OR_DEPARTMENT_OTHER): Payer: Self-pay

## 2024-01-25 ENCOUNTER — Emergency Department (HOSPITAL_BASED_OUTPATIENT_CLINIC_OR_DEPARTMENT_OTHER)
Admission: EM | Admit: 2024-01-25 | Discharge: 2024-01-25 | Disposition: A | Discharge status: Home / Self Care | Attending: Emergency Medicine | Admitting: Emergency Medicine

## 2024-01-25 DIAGNOSIS — R1084 Generalized abdominal pain: Secondary | ICD-10-CM | POA: Insufficient documentation

## 2024-01-25 DIAGNOSIS — I10 Essential (primary) hypertension: Secondary | ICD-10-CM | POA: Insufficient documentation

## 2024-01-25 LAB — COMPREHENSIVE METABOLIC PANEL WITH GFR
ALT: 9 U/L (ref 0–44)
AST: 20 U/L (ref 15–41)
Albumin: 4.4 g/dL (ref 3.5–5.0)
Alkaline Phosphatase: 70 U/L (ref 38–126)
Anion gap: 13 (ref 5–15)
BUN: 9 mg/dL (ref 8–23)
CO2: 26 mmol/L (ref 22–32)
Calcium: 9.7 mg/dL (ref 8.9–10.3)
Chloride: 102 mmol/L (ref 98–111)
Creatinine, Ser: 0.88 mg/dL (ref 0.44–1.00)
GFR, Estimated: >60 mL/min (ref >60)
Glucose, Bld: 114 mg/dL — ABNORMAL HIGH (ref 70–99)
Potassium: 3.7 mmol/L (ref 3.5–5.1)
Sodium: 140 mmol/L (ref 135–145)
Total Bilirubin: 0.8 mg/dL (ref 0.0–1.2)
Total Protein: 7.6 g/dL (ref 6.5–8.1)

## 2024-01-25 LAB — CBC
HCT: 44.5 % (ref 36.0–46.0)
Hemoglobin: 14.9 g/dL (ref 12.0–15.0)
MCH: 30.2 pg (ref 26.0–34.0)
MCHC: 33.5 g/dL (ref 30.0–36.0)
MCV: 90.1 fL (ref 80.0–100.0)
Platelets: 303 K/uL (ref 150–400)
RBC: 4.94 MIL/uL (ref 3.87–5.11)
RDW: 13.6 % (ref 11.5–15.5)
WBC: 9.6 K/uL (ref 4.0–10.5)
nRBC: 0 % (ref 0.0–0.2)

## 2024-01-25 LAB — URINALYSIS, ROUTINE W REFLEX MICROSCOPIC
Bilirubin Urine: NEGATIVE
Glucose, UA: NEGATIVE mg/dL
Hgb urine dipstick: NEGATIVE
Ketones, ur: 15 mg/dL — AB
Leukocytes,Ua: NEGATIVE
Nitrite: NEGATIVE
Protein, ur: NEGATIVE mg/dL
Specific Gravity, Urine: 1.015 (ref 1.005–1.030)
pH: 7 (ref 5.0–8.0)

## 2024-01-25 LAB — LIPASE, BLOOD: Lipase: 15 U/L (ref 11–51)

## 2024-01-25 MED ORDER — DICYCLOMINE HCL 10 MG PO CAPS
10.0000 mg | ORAL_CAPSULE | Freq: Once | ORAL | Status: AC
  Administered 2024-01-25: 10 mg via ORAL
  Filled 2024-01-25: qty 1

## 2024-01-25 MED ORDER — SENNOSIDES-DOCUSATE SODIUM 8.6-50 MG PO TABS: 1.0000 | ORAL_TABLET | Freq: Every evening | ORAL | 0 refills | Status: AC | PRN

## 2024-01-25 MED ORDER — DICYCLOMINE HCL 20 MG PO TABS: 20.0000 mg | ORAL_TABLET | Freq: Three times a day (TID) | ORAL | 0 refills | Status: AC | PRN

## 2024-01-25 MED ORDER — IOHEXOL 300 MG/ML  SOLN
80.0000 mL | Freq: Once | INTRAMUSCULAR | Status: AC | PRN
  Administered 2024-01-25: 80 mL via INTRAVENOUS

## 2024-01-25 NOTE — Discharge Instructions (Signed)

## 2024-01-25 NOTE — ED Notes (Signed)
 Discharge instructions reviewed with patient. Patient questions answered and opportunity for education reviewed. Patient voices understanding of discharge instructions with no further questions. Patient ambulatory with steady gait to lobby.

## 2024-01-25 NOTE — ED Provider Notes (Signed)
 Emergency Department Provider Note   I have reviewed the triage vital signs and the nursing notes.   HISTORY  Chief Complaint Abdominal Pain   HPI Mary Galloway is a 83 y.o. female past history of hypertension presents emergency department with generalized abdominal pain.  She had some nausea and vomiting yesterday but none today.  No dysuria, hesitancy, urgency.  No diarrhea.  No fevers.  She last had a BM yesterday and feels like she is passing flatus today.  No pain up into the chest or shortness of breath.   Past Medical History:  Diagnosis Date   Arthritis    Hypertension     Review of Systems  Constitutional: No fever/chills Cardiovascular: Denies chest pain. Respiratory: Denies shortness of breath. Gastrointestinal: Positive abdominal pain. Positive nausea and vomiting yesterday.  No diarrhea.  No constipation. Genitourinary: Negative for dysuria. Neurological: Negative for headaches.  ____________________________________________   PHYSICAL EXAM:  VITAL SIGNS: ED Triage Vitals [01/25/24 1643]  Encounter Vitals Group     BP (!) 125/102     Pulse Rate 83     Resp 18     Temp 98 F (36.7 C)     Temp Source Oral     SpO2 99 %   Constitutional: Alert and oriented. Well appearing and in no acute distress. Eyes: Conjunctivae are normal.  Head: Atraumatic. Nose: No congestion/rhinnorhea. Mouth/Throat: Mucous membranes are moist.   Neck: No stridor. Cardiovascular: Normal rate, regular rhythm. Good peripheral circulation. Grossly normal heart sounds.   Respiratory: Normal respiratory effort.  No retractions. Lungs CTAB. Gastrointestinal: Soft with minimal tenderness diffusely. No distention.  Musculoskeletal: No gross deformities of extremities. Neurologic:  Normal speech and language.  Skin:  Skin is warm, dry and intact. No rash noted.  ____________________________________________   LABS (all labs ordered are listed, but only abnormal results are  displayed)  Labs Reviewed  COMPREHENSIVE METABOLIC PANEL WITH GFR - Abnormal; Notable for the following components:      Result Value   Glucose, Bld 114 (*)    All other components within normal limits  URINALYSIS, ROUTINE W REFLEX MICROSCOPIC - Abnormal; Notable for the following components:   Ketones, ur 15 (*)    All other components within normal limits  LIPASE, BLOOD  CBC   ____________________________________________  RADIOLOGY  CT ABDOMEN PELVIS W CONTRAST Result Date: 01/25/2024 CLINICAL DATA:  Acute abdominal pain EXAM: CT ABDOMEN AND PELVIS WITH CONTRAST TECHNIQUE: Multidetector CT imaging of the abdomen and pelvis was performed using the standard protocol following bolus administration of intravenous contrast. RADIATION DOSE REDUCTION: This exam was performed according to the departmental dose-optimization program which includes automated exposure control, adjustment of the mA and/or kV according to patient size and/or use of iterative reconstruction technique. CONTRAST:  80mL OMNIPAQUE  IOHEXOL  300 MG/ML  SOLN COMPARISON:  CT abdomen and pelvis 09/22/2023 FINDINGS: Lower chest: No acute abnormality. Hepatobiliary: There are numerous hepatic cysts and hypodensities which are too small to characterize. The largest cyst is in the right lobe of the liver measuring 3.3 cm. The gallbladder is filled with gallstones. There is no biliary ductal dilatation. Pancreas: Unremarkable. No pancreatic ductal dilatation or surrounding inflammatory changes. Spleen: Normal in size without focal abnormality. Bilateral adrenal glands are within normal limits. Bilateral renal cysts are again noted. The largest is in the left kidney measuring 2.6 cm. There is no hydronephrosis or perinephric fluid. The bladder is within normal limits. Adrenals/Urinary Tract: There are scattered colonic diverticula. There is no acute inflammation.  The appendix is not seen. There is no pneumatosis, free air or bowel  obstruction. There is a small hiatal hernia. The stomach is otherwise decompressed. Stomach/Bowel: Stomach is within normal limits. Appendix appears normal. No evidence of bowel wall thickening, distention, or inflammatory changes. Vascular/Lymphatic: The uterus is surgically absent. Reproductive: Uterus and bilateral adnexa are unremarkable. Other: Degenerative changes affect the spine. Musculoskeletal: Degenerative changes affect the spine and hips. IMPRESSION: 1. No acute localizing process in the abdomen or pelvis. 2. Cholelithiasis. 3. Colonic diverticulosis. 4. Small hiatal hernia. Electronically Signed   By: Greig Pique M.D.   On: 01/25/2024 19:08    ____________________________________________   PROCEDURES  Procedure(s) performed:   Procedures  None  ____________________________________________   INITIAL IMPRESSION / ASSESSMENT AND PLAN / ED COURSE  Pertinent labs & imaging results that were available during my care of the patient were reviewed by me and considered in my medical decision making (see chart for details).   This patient is Presenting for Evaluation of abdominal pain, which does require a range of treatment options, and is a complaint that involves a high risk of morbidity and mortality.  The Differential Diagnoses includes but is not exclusive to acute cholecystitis, intrathoracic causes for epigastric abdominal pain, gastritis, duodenitis, pancreatitis, small bowel or large bowel obstruction, abdominal aortic aneurysm, hernia, gastritis, etc.  Clinical Laboratory Tests Ordered, included UA without infection. CBC without leukocytosis. No AKI. LFTs and Lipase negative.   Radiologic Tests Ordered, included CT abdomen/pelvis. I independently interpreted the images and agree with radiology interpretation.   Cardiac Monitor Tracing which shows NSR.    Social Determinants of Health Risk patient is a non-smoker.   Medical Decision Making: Summary:  The patient  presents the emergency department with generalized abdominal pain and some nausea/vomiting yesterday.  Mild tenderness on exam.  Given exam and patient's age plan for CT abdomen pelvis lung screening blood work.  Reevaluation with update and discussion with patient. No localizing findings on CT. Labs are reassuring. Plan for bentyl and constipation mgmt at home with strict ED return precautions discussed.   Patient's presentation is most consistent with acute presentation with potential threat to life or bodily function.   Disposition: discharge  ____________________________________________  FINAL CLINICAL IMPRESSION(S) / ED DIAGNOSES  Final diagnoses:  Generalized abdominal pain     NEW OUTPATIENT MEDICATIONS STARTED DURING THIS VISIT:  Discharge Medication List as of 01/25/2024  8:37 PM     START taking these medications   Details  dicyclomine (BENTYL) 20 MG tablet Take 1 tablet (20 mg total) by mouth 3 (three) times daily as needed., Starting Mon 01/25/2024, Normal    senna-docusate (SENOKOT-S) 8.6-50 MG tablet Take 1 tablet by mouth at bedtime as needed for mild constipation., Starting Mon 01/25/2024, Normal        Note:  This document was prepared using Dragon voice recognition software and may include unintentional dictation errors.  Fonda Law, MD, [facility]    Naethan Bracewell, Fonda MATSU, MD 01/25/24 2121

## 2024-01-25 NOTE — ED Triage Notes (Signed)
 Reports generalized abd pain, N/V since yesterday.  Denies diarrhea, urinary symptoms, fevers
